# Patient Record
Sex: Male | Born: 1970 | Race: Black or African American | Hispanic: No | Smoking: Current every day smoker
Health system: Southern US, Community
[De-identification: ages and names within clinical notes are randomized; demographics above are authoritative.]

## PROBLEM LIST (undated history)

## (undated) DIAGNOSIS — I10 Essential (primary) hypertension: Secondary | ICD-10-CM

## (undated) DIAGNOSIS — G4730 Sleep apnea, unspecified: Secondary | ICD-10-CM

## (undated) HISTORY — DX: Sleep apnea, unspecified: G47.30

## (undated) HISTORY — DX: Essential (primary) hypertension: I10

## (undated) HISTORY — PX: HERNIA REPAIR: SHX51

---

## 1996-06-11 HISTORY — PX: HERNIA REPAIR: SHX51

## 2008-02-05 ENCOUNTER — Emergency Department: Payer: Self-pay

## 2009-02-03 ENCOUNTER — Emergency Department: Payer: Self-pay | Admitting: Emergency Medicine

## 2009-04-03 ENCOUNTER — Emergency Department: Payer: Self-pay | Admitting: Emergency Medicine

## 2010-01-20 ENCOUNTER — Emergency Department: Payer: Self-pay | Admitting: Emergency Medicine

## 2010-08-21 IMAGING — CR DG KNEE COMPLETE 4+V*L*
1 series · 4 of 4 positions shown · non-contrast
Comparison: none

REASON FOR EXAM: pain
COMMENTS:

[Series 1: view not recorded · 0.17mm/px · 4 of 4 slices shown]
[im 1/4]
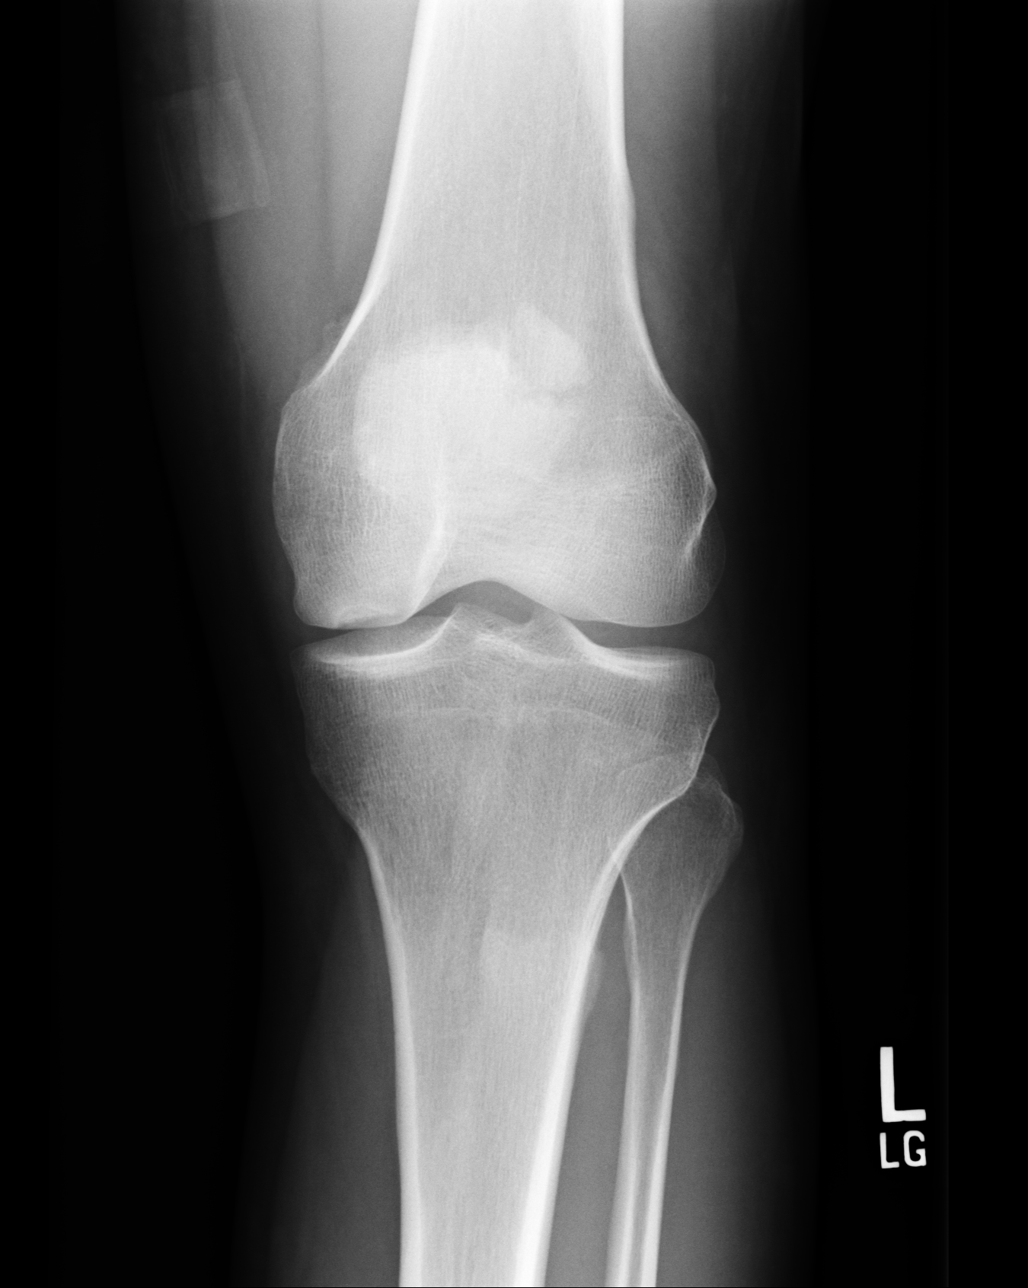
[im 2/4]
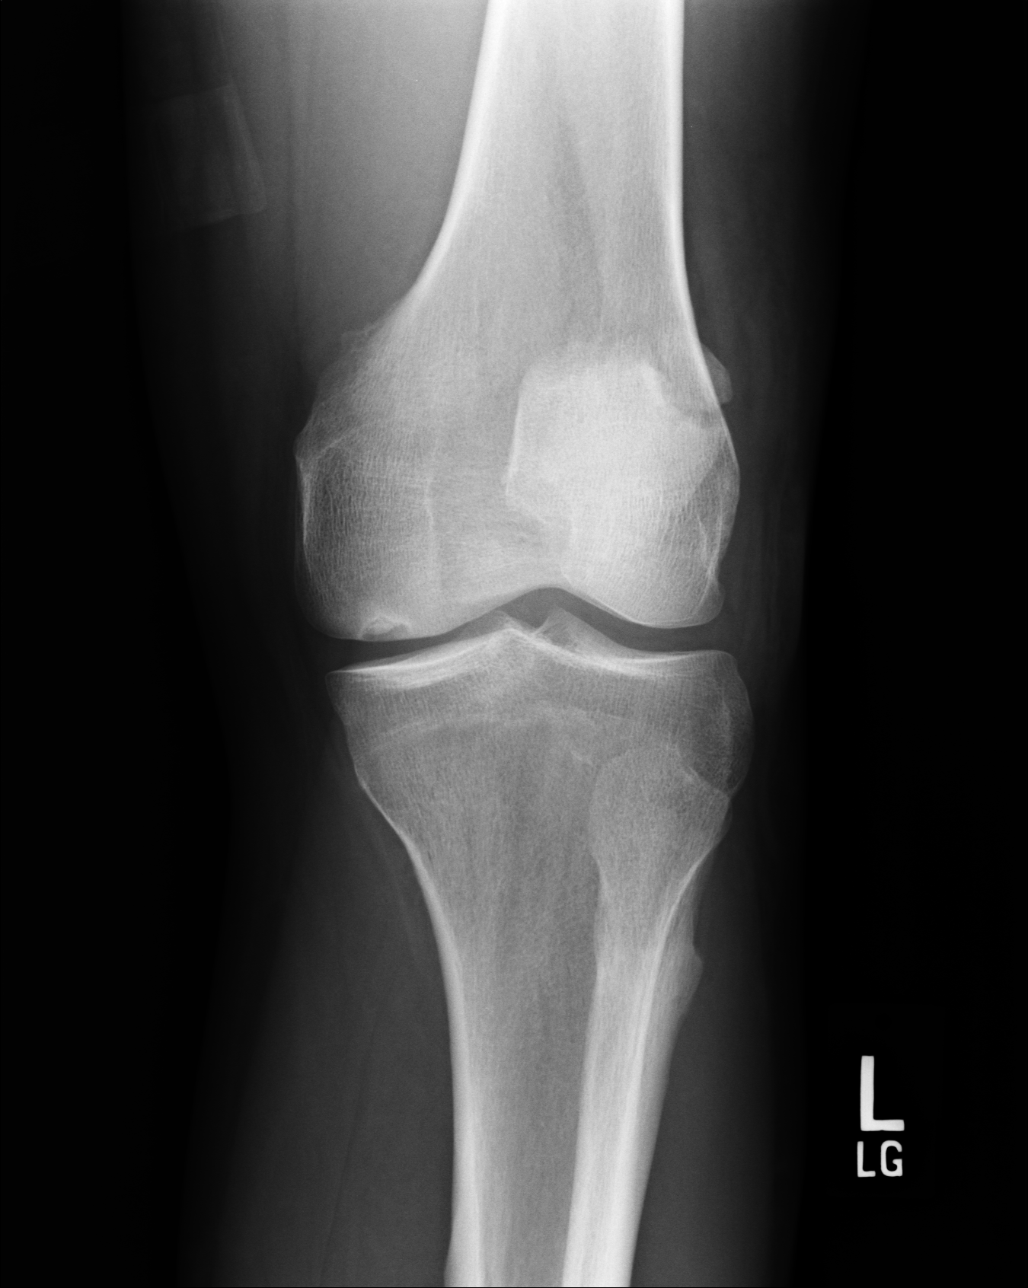
[im 3/4]
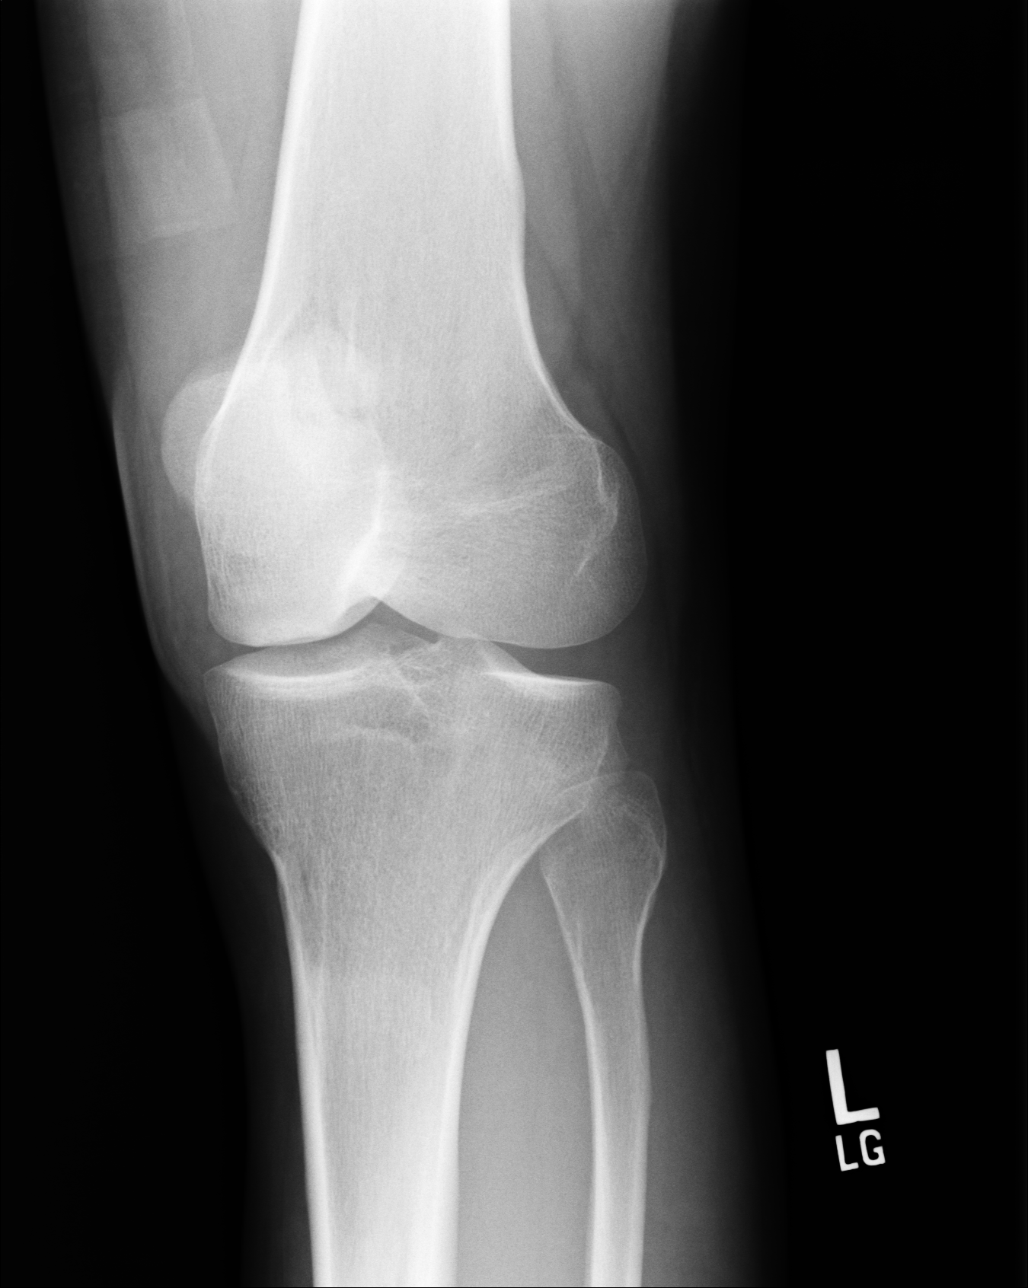
[im 4/4]
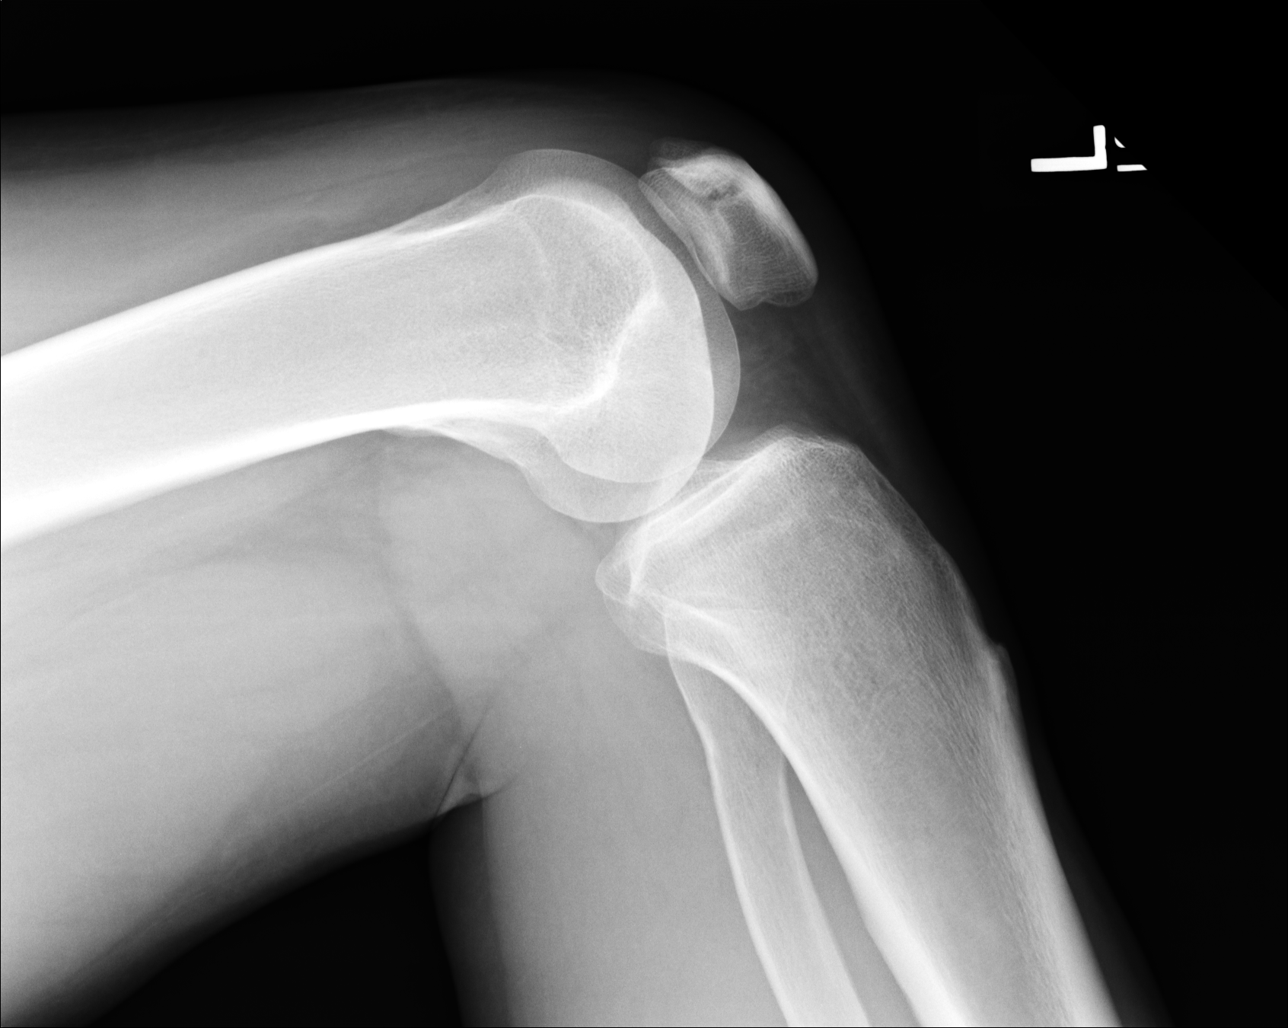

[4 of 4 positions shown; findings below may reference images not displayed]

PROCEDURE:     DXR - DXR KNEE LT COMP WITH OBLIQUES  - February 05, 2008  [DATE]

RESULT:     Four views of the knee were obtained. No fracture or dislocation
about the knee joint is seen. There is noted a radiolucent defect in the
medial femoral condyle consistent with osteochondritis dissecans. The knee
joint space is well maintained. There is a 2 cm detached bony density
associated with the superior outer margin of the patella. This likely is
secondary to bipartite patella. Fracture cannot be totally excluded from the
views available. Correlation with clinical findings is needed.
IMPRESSION: 1. There are observed changes consistent with osteochondritis dissecans of
the articular surface of the medial femoral condyle.
2. There is a lucency separating an osseous density from the body of the
patella along the superior outer margin of the patella. This could be
further evaluated by CT if clinically indicated.

## 2011-11-08 ENCOUNTER — Emergency Department: Payer: Self-pay | Admitting: Unknown Physician Specialty

## 2012-06-11 ENCOUNTER — Emergency Department: Payer: Self-pay | Admitting: Unknown Physician Specialty

## 2013-03-27 ENCOUNTER — Emergency Department: Payer: Self-pay | Admitting: Emergency Medicine

## 2013-03-27 LAB — CBC
HCT: 42.3 % (ref 40.0–52.0)
MCV: 84 fL (ref 80–100)
Platelet: 204 10*3/uL (ref 150–440)
WBC: 7 10*3/uL (ref 3.8–10.6)

## 2013-03-28 LAB — COMPREHENSIVE METABOLIC PANEL
Albumin: 3.4 g/dL (ref 3.4–5.0)
Alkaline Phosphatase: 117 U/L (ref 50–136)
Anion Gap: 5 — ABNORMAL LOW (ref 7–16)
Bilirubin,Total: 0.2 mg/dL (ref 0.2–1.0)
Calcium, Total: 8.5 mg/dL (ref 8.5–10.1)
Chloride: 106 mmol/L (ref 98–107)
Co2: 29 mmol/L (ref 21–32)
EGFR (Non-African Amer.): >60
Glucose: 108 mg/dL — ABNORMAL HIGH (ref 65–99)
Osmolality: 280 (ref 275–301)
Potassium: 3.7 mmol/L (ref 3.5–5.1)
SGOT(AST): 24 U/L (ref 15–37)
SGPT (ALT): 34 U/L (ref 12–78)
Sodium: 140 mmol/L (ref 136–145)
Total Protein: 8.1 g/dL (ref 6.4–8.2)

## 2013-03-28 LAB — URINALYSIS, COMPLETE
Bilirubin,UR: NEGATIVE
Glucose,UR: NEGATIVE mg/dL (ref 0–75)
Nitrite: NEGATIVE
Protein: NEGATIVE
RBC,UR: 1 /HPF (ref 0–5)
Specific Gravity: 1.032 (ref 1.003–1.030)
Squamous Epithelial: <1

## 2013-03-28 LAB — CK TOTAL AND CKMB (NOT AT ARMC): CK-MB: 0.5 ng/mL (ref 0.5–3.6)

## 2013-03-28 LAB — TROPONIN I: Troponin-I: <0.02 ng/mL

## 2015-10-26 ENCOUNTER — Emergency Department: Payer: Self-pay

## 2015-10-26 ENCOUNTER — Emergency Department
Admission: EM | Admit: 2015-10-26 | Discharge: 2015-10-26 | Discharge status: Against Medical Advice | Payer: Self-pay | Attending: Emergency Medicine | Admitting: Emergency Medicine

## 2015-10-26 ENCOUNTER — Encounter: Payer: Self-pay | Admitting: Emergency Medicine

## 2015-10-26 DIAGNOSIS — F17200 Nicotine dependence, unspecified, uncomplicated: Secondary | ICD-10-CM | POA: Insufficient documentation

## 2015-10-26 DIAGNOSIS — R221 Localized swelling, mass and lump, neck: Secondary | ICD-10-CM | POA: Insufficient documentation

## 2015-10-26 LAB — CBC WITH DIFFERENTIAL/PLATELET
BASOS PCT: 1 %
Basophils Absolute: 0.1 10*3/uL (ref 0–0.1)
EOS ABS: 0.2 10*3/uL (ref 0–0.7)
EOS PCT: 2 %
HCT: 42.2 % (ref 40.0–52.0)
Hemoglobin: 14.2 g/dL (ref 13.0–18.0)
LYMPHS ABS: 1.6 10*3/uL (ref 1.0–3.6)
Lymphocytes Relative: 21 %
MCH: 28.1 pg (ref 26.0–34.0)
MCHC: 33.6 g/dL (ref 32.0–36.0)
MCV: 83.5 fL (ref 80.0–100.0)
MONOS PCT: 11 %
Monocytes Absolute: 0.8 10*3/uL (ref 0.2–1.0)
Neutro Abs: 4.9 10*3/uL (ref 1.4–6.5)
Neutrophils Relative %: 65 %
PLATELETS: 182 10*3/uL (ref 150–440)
RBC: 5.05 MIL/uL (ref 4.40–5.90)
RDW: 14.1 % (ref 11.5–14.5)
WBC: 7.5 10*3/uL (ref 3.8–10.6)

## 2015-10-26 LAB — BASIC METABOLIC PANEL
Anion gap: 6 (ref 5–15)
BUN: 18 mg/dL (ref 6–20)
CALCIUM: 8.5 mg/dL — AB (ref 8.9–10.3)
CO2: 24 mmol/L (ref 22–32)
CREATININE: 1.01 mg/dL (ref 0.61–1.24)
Chloride: 108 mmol/L (ref 101–111)
Glucose, Bld: 89 mg/dL (ref 65–99)
Potassium: 3.6 mmol/L (ref 3.5–5.1)
SODIUM: 138 mmol/L (ref 135–145)

## 2015-10-26 NOTE — ED Notes (Signed)
Pt with right side jaw pain and swelling started yesterday afternoon. No injury. Denies any other symptoms at this time.

## 2015-10-26 NOTE — ED Notes (Signed)
Inform EDT that he wanted to leave  D/t wait times for test.  I called the u/s department to check on time . I explained that she was on her way to do test   But they left

## 2015-10-26 NOTE — ED Notes (Signed)
See triage note  Right side of jaw/facial swelling for couple of days  Unsure of fever   Afebrile on arrival..

## 2015-10-26 NOTE — ED Provider Notes (Signed)
[facility] Emergency Department Provider Note  ____________________________________________  Time seen: Approximately 1:53 PM  I have reviewed the triage vital signs and the nursing notes.   HISTORY  Chief Complaint Facial Pain    HPI Curtis Anderson is a 45 y.o. male , NAD, presents to the emergency department with one-day history of right jaw pain and swelling. States he noted some pain about his right jaw yesterday. Over the last 24 hours he was then the area has swollen significantly. Pain has been increasing since that time. Denies any fever, chills, body aches. Denies any dental pain, nasal congestion, runny nose, ear pain or sinus pressure. Denies any injuries or traumas to the face or neck. Has   History reviewed. No pertinent past medical history.  There are no active problems to display for this patient.   History reviewed. No pertinent past surgical history.  No current outpatient prescriptions on file.  Allergies Review of patient's allergies indicates no known allergies.  No family history on file.  Social History Social History  Substance Use Topics  . Smoking status: Current Every Day Smoker  . Smokeless tobacco: None  . Alcohol Use: Yes     Review of Systems  Constitutional: No fever/chills, fatigue Eyes: No visual changes. No discharge ENT: Swelling and pain about the right neck and jawline. No sore throat or nasal congestion, ear pain, runny nose, dental pain. Cardiovascular: No chest pain. Respiratory: No cough. No shortness of breath. No wheezing.  Gastrointestinal: No abdominal pain.  No nausea, vomiting. Musculoskeletal: Negative for back, neck pain.  Skin: Negative for rash, redness, skin sores, open wounds. Neurological: Negative for headaches, focal weakness or numbness. No tingling 10-point ROS otherwise negative.  ____________________________________________   PHYSICAL EXAM:  VITAL SIGNS: ED Triage Vitals   Enc Vitals Group     BP 10/26/15 1322 128/66 mmHg     Pulse Rate 10/26/15 1322 87     Resp 10/26/15 1322 18     Temp 10/26/15 1322 99.2 F (37.3 C)     Temp Source 10/26/15 1322 Oral     SpO2 10/26/15 1322 95 %     Weight 10/26/15 1322 210 lb (95.255 kg)     Height 10/26/15 1322 5\' 9"  (1.753 m)     Head Cir --      Peak Flow --      Pain Score 10/26/15 1324 5     Pain Loc --      Pain Edu? --      Excl. in GC? --      Constitutional: Alert and oriented. Well appearing and in no acute distress. Eyes: Conjunctivae are normal.  Head: Atraumatic. ENT:      Ears: Right TM visualized with mild bulging and a dusky appearance. No perforation or overt erythema is noted. Left TM visualized without bulging, effusion, erythema, perforation.      Nose: No congestion/rhinnorhea.      Mouth/Throat: Mucous membranes are moist. Poor dentition is noted but right upper and lower gumline with no erythema, swelling nor tenderness to palpation. Pharynx without erythema, swelling, exudates. Uvula is midline. Neck: Approximately 3 cm x 2 cm oblong swollen area along the right jaw line. Mild tenderness to palpation. Supple with full range of motion. Hematological/Lymphatic/Immunilogical: No cervical, preauricular, postauricular lymphadenopathy. Cardiovascular: Normal rate, regular rhythm. Normal S1 and S2.  Good peripheral circulation with 2+ pulses noted in the bilateral upper extremities. Respiratory: Normal respiratory effort without tachypnea or retractions. Lungs CTAB  breath sounds noted in all lung fields. Musculoskeletal: No TMJ tenderness. Neurologic:  Normal speech and language. No gross focal neurologic deficits are appreciated.  Skin:  Skin is warm, dry and intact. No rash, skin sores noted. Psychiatric: Mood and affect are normal. Speech and behavior are normal. Patient exhibits appropriate insight and judgement.   ____________________________________________   LABS (all labs ordered are  listed, but only abnormal results are displayed)  Labs Reviewed  BASIC METABOLIC PANEL - Abnormal; Notable for the following:    Calcium 8.5 (*)    All other components within normal limits  CBC WITH DIFFERENTIAL/PLATELET   ____________________________________________  EKG  None ____________________________________________  RADIOLOGY  I have personally viewed and evaluated these images (plain radiographs) as part of my medical decision making, as well as reviewing the written report by the radiologist.  No results found.   Patient left AMA as ultrasound tech was approaching to take him for his soft tissue ultrasound. ____________________________________________    PROCEDURES  Procedure(s) performed: None   Medications - No data to display   ____________________________________________   INITIAL IMPRESSION / ASSESSMENT AND PLAN / ED COURSE  Patient inquired in regards to the status of his ultrasound. I called ultrasound department and was told that the ultrasound tech was coming to get the patient for his imaging study. Patient stated that he wanted to leave and go to [facility] for further evaluation and treatment. Ultrasound tech entered the room and the patient continued to decline imaging at this time. Patient removed his IV forcibly and left the emergency department ambulating well without pain.     ____________________________________________  FINAL CLINICAL IMPRESSION(S) / ED DIAGNOSES  Final diagnoses:  Localized swelling, mass and lump, neck      NEW MEDICATIONS STARTED DURING THIS VISIT:  There are no discharge medications for this patient.        Hope Pigeon, PA-C 10/26/15 1547  Myrna Blazer, MD 10/27/15 [phone]

## 2016-11-19 ENCOUNTER — Emergency Department
Admission: EM | Admit: 2016-11-19 | Discharge: 2016-11-19 | Disposition: A | Discharge status: Home / Self Care | Payer: Self-pay | Attending: Emergency Medicine | Admitting: Emergency Medicine

## 2016-11-19 ENCOUNTER — Emergency Department: Payer: Self-pay

## 2016-11-19 ENCOUNTER — Encounter: Payer: Self-pay | Admitting: Emergency Medicine

## 2016-11-19 DIAGNOSIS — F17200 Nicotine dependence, unspecified, uncomplicated: Secondary | ICD-10-CM | POA: Insufficient documentation

## 2016-11-19 DIAGNOSIS — M940 Chondrocostal junction syndrome [Tietze]: Secondary | ICD-10-CM | POA: Insufficient documentation

## 2016-11-19 MED ORDER — KETOROLAC TROMETHAMINE 60 MG/2ML IM SOLN
60.0000 mg | Freq: Once | INTRAMUSCULAR | Status: AC
  Administered 2016-11-19: 60 mg via INTRAMUSCULAR
  Filled 2016-11-19: qty 2

## 2016-11-19 MED ORDER — CYCLOBENZAPRINE HCL 10 MG PO TABS: 10.0000 mg | ORAL_TABLET | Freq: Three times a day (TID) | ORAL | 0 refills | Status: DC | PRN

## 2016-11-19 MED ORDER — TRAMADOL HCL 50 MG PO TABS: 50.0000 mg | ORAL_TABLET | Freq: Four times a day (QID) | ORAL | 0 refills | Status: DC | PRN

## 2016-11-19 MED ORDER — IBUPROFEN 600 MG PO TABS: 600.0000 mg | ORAL_TABLET | Freq: Three times a day (TID) | ORAL | 0 refills | Status: DC | PRN

## 2016-11-19 MED ORDER — HYDROMORPHONE HCL 1 MG/ML IJ SOLN
1.0000 mg | Freq: Once | INTRAMUSCULAR | Status: AC
  Administered 2016-11-19: 1 mg via INTRAMUSCULAR
  Filled 2016-11-19: qty 1

## 2016-11-19 NOTE — ED Provider Notes (Signed)
[facility] Emergency Department Provider Note   ____________________________________________   First MD Initiated Contact with Patient 11/19/16 1443     (approximate)  I have reviewed the triage vital signs and the nursing notes.   HISTORY  Chief Complaint Rib pain    HPI Curtis Anderson is a 46 y.o. male  Of left anterior chest wall pain secondary to a forceful coughing episode. Patient stated pain causing a fall to his knees. Patient is now splinting with deep inspirations.Patient rates his pain as a 10 over 10. Patient is gravida pain as "sharp".   History reviewed. No pertinent past medical history.  There are no active problems to display for this patient.   History reviewed. No pertinent surgical history.  Prior to Admission medications   Medication Sig Start Date End Date Taking? Authorizing Provider  cyclobenzaprine (FLEXERIL) 10 MG tablet Take 1 tablet (10 mg total) by mouth 3 (three) times daily as needed. 11/19/16   Joni Reining, PA-C  ibuprofen (ADVIL,MOTRIN) 600 MG tablet Take 1 tablet (600 mg total) by mouth every 8 (eight) hours as needed. 11/19/16   Joni Reining, PA-C  traMADol (ULTRAM) 50 MG tablet Take 1 tablet (50 mg total) by mouth every 6 (six) hours as needed for moderate pain. 11/19/16   Joni Reining, PA-C    Allergies Patient has no known allergies.  No family history on file.  Social History Social History  Substance Use Topics  . Smoking status: Current Every Day Smoker  . Smokeless tobacco: Never Used  . Alcohol use Yes    Review of Systems  Constitutional: No fever/chills Eyes: No visual changes. ENT: No sore throat. Cardiovascular: Chest wall pain Respiratory: Denies shortness of breath. Gastrointestinal: No abdominal pain.  No nausea, no vomiting.  No diarrhea.  No constipation. Genitourinary: Negative for dysuria. Musculoskeletal: Negative for back pain. Skin: Negative for rash. Neurological:  Negative for headaches, focal weakness or numbness.   ____________________________________________   PHYSICAL EXAM:  VITAL SIGNS: ED Triage Vitals [11/19/16 1341]  Enc Vitals Group     BP 131/76     Pulse Rate 63     Resp 16     Temp 98 F (36.7 C)     Temp Source Oral     SpO2 95 %     Weight 215 lb (97.5 kg)     Height 5\' 9"  (1.753 m)     Head Circumference      Peak Flow      Pain Score 10     Pain Loc      Pain Edu?      Excl. in GC?     Constitutional: Alert and oriented. Well appearing and in no acute distress. Cardiovascular: Normal rate, regular rhythm. Grossly normal heart sounds.  Good peripheral circulation. Respiratory: Normal respiratory effort.  No retractions. Lungs CTAB. Musculoskeletal: with obvious chest wall deformity. Patient splinting with deep respirations. Lungs clear to auscultation. Neurologic:  Normal speech and language. No gross focal neurologic deficits are appreciated. No gait instability. Skin:  Skin is warm, dry and intact. No rash noted. Psychiatric: Mood and affect are normal. Speech and behavior are normal.  ____________________________________________   LABS (all labs ordered are listed, but only abnormal results are displayed)  Labs Reviewed - No data to display ____________________________________________  EKG   ____________________________________________  RADIOLOGY  Dg Chest 2 View  Result Date: 11/19/2016 CLINICAL DATA:  Left-sided chest pain and cough since this morning.  Left-sided chest pain began after coughing. EXAM: CHEST  2 VIEW COMPARISON:  03/27/2013 FINDINGS: Lungs are adequately inflated without effusion or pneumothorax. Subtle density over the mid aspect of the lung bases on the lateral film only likely atelectasis. Cardiomediastinal silhouette and remainder of the exam is unchanged. IMPRESSION: Subtle basilar density likely atelectasis. Recommend follow-up chest radiograph 4-6 weeks. Electronically Signed   By:  Elberta Fortisaniel  Boyle M.D.   On: 11/19/2016 15:10     No acute findings on x-ray of the chest.  ____________________________________________   PROCEDURES  Procedure(s) performed: None  Procedures  Critical Care performed: No  ____________________________________________   INITIAL IMPRESSION / ASSESSMENT AND PLAN / ED COURSE  Pertinent labs & imaging results that were available during my care of the patient were reviewed by me and considered in my medical decision making (see chart for details).   Costochondritis. Patient given discharge care instructions. Patient [facility].      ____________________________________________   FINAL CLINICAL IMPRESSION(S) / ED DIAGNOSES  Final diagnoses:  Costochondritis, acute      NEW MEDICATIONS STARTED DURING THIS VISIT:  New Prescriptions   CYCLOBENZAPRINE (FLEXERIL) 10 MG TABLET    Take 1 tablet (10 mg total) by mouth 3 (three) times daily as needed.   IBUPROFEN (ADVIL,MOTRIN) 600 MG TABLET    Take 1 tablet (600 mg total) by mouth every 8 (eight) hours as needed.   TRAMADOL (ULTRAM) 50 MG TABLET    Take 1 tablet (50 mg total) by mouth every 6 (six) hours as needed for moderate pain.     Note:  This document was prepared using Dragon voice recognition software and may include unintentional dictation errors.    Joni ReiningSmith, Ronald K, PA-C 11/19/16 1522    Merrily Brittleifenbark, Neil, MD 11/19/16 [phone]

## 2016-11-19 NOTE — ED Triage Notes (Addendum)
Pt reports coughing this morning and now has pain on left side. Pt denies illness. Pt states pain brought him to his knees. Pt denies hearing or feeling a pop. Pt reports side did not hurt until he coughed. Pt ambulatory to triage. Pt appears uncomfortable.

## 2016-11-19 NOTE — ED Notes (Signed)
See triage note  States he coughed while at work today  Felt a pop to left lateral chest/rib area  States pain took him to his knees

## 2018-09-03 ENCOUNTER — Other Ambulatory Visit: Payer: Self-pay

## 2018-09-03 ENCOUNTER — Emergency Department
Admission: EM | Admit: 2018-09-03 | Discharge: 2018-09-03 | Disposition: A | Discharge status: Home / Self Care | Payer: Self-pay | Attending: Emergency Medicine | Admitting: Emergency Medicine

## 2018-09-03 ENCOUNTER — Encounter: Payer: Self-pay | Admitting: Emergency Medicine

## 2018-09-03 DIAGNOSIS — Y929 Unspecified place or not applicable: Secondary | ICD-10-CM | POA: Insufficient documentation

## 2018-09-03 DIAGNOSIS — F17210 Nicotine dependence, cigarettes, uncomplicated: Secondary | ICD-10-CM | POA: Insufficient documentation

## 2018-09-03 DIAGNOSIS — Z79899 Other long term (current) drug therapy: Secondary | ICD-10-CM | POA: Insufficient documentation

## 2018-09-03 DIAGNOSIS — X58XXXA Exposure to other specified factors, initial encounter: Secondary | ICD-10-CM | POA: Insufficient documentation

## 2018-09-03 DIAGNOSIS — S39012A Strain of muscle, fascia and tendon of lower back, initial encounter: Secondary | ICD-10-CM | POA: Insufficient documentation

## 2018-09-03 DIAGNOSIS — Y999 Unspecified external cause status: Secondary | ICD-10-CM | POA: Insufficient documentation

## 2018-09-03 DIAGNOSIS — Y939 Activity, unspecified: Secondary | ICD-10-CM | POA: Insufficient documentation

## 2018-09-03 MED ORDER — MELOXICAM 15 MG PO TABS: 15.0000 mg | ORAL_TABLET | Freq: Every day | ORAL | 2 refills | Status: AC

## 2018-09-03 MED ORDER — CYCLOBENZAPRINE HCL 10 MG PO TABS: 10.0000 mg | ORAL_TABLET | Freq: Three times a day (TID) | ORAL | 0 refills | Status: DC | PRN

## 2018-09-03 NOTE — ED Provider Notes (Signed)
[facility] Emergency Department Provider Note ____________________________________________  Time seen: Approximately 3:44 PM  I have reviewed the triage vital signs and the nursing notes.   HISTORY  Chief Complaint Back Pain    HPI Curtis Anderson is a 48 y.o. male who presents to the emergency department for evaluation and treatment of right side low back pain without known injury. No relief with tylenol or advil. Symptoms started 3 days ago.   History reviewed. No pertinent past medical history.  There are no active problems to display for this patient.   History reviewed. No pertinent surgical history.  Prior to Admission medications   Medication Sig Start Date End Date Taking? Authorizing Provider  cyclobenzaprine (FLEXERIL) 10 MG tablet Take 1 tablet (10 mg total) by mouth 3 (three) times daily as needed. 09/03/18   Irl Bodie, Rulon Eisenmenger B, FNP  meloxicam (MOBIC) 15 MG tablet Take 1 tablet (15 mg total) by mouth daily. 09/03/18 09/03/19  Chinita Pester, FNP    Allergies Patient has no known allergies.  No family history on file.  Social History Social History   Tobacco Use  . Smoking status: Current Every Day Smoker    Packs/day: 0.50    Types: Cigarettes  . Smokeless tobacco: Never Used  Substance Use Topics  . Alcohol use: Yes  . Drug use: Yes    Types: Marijuana    Review of Systems Constitutional: Negative for fever. Cardiovascular: Negative for chest pain. Respiratory: Negative for shortness of breath. Musculoskeletal: Positive for right lower back pain Skin: Negative for open wound or lesion.  Neurological: Negative for decrease in sensation  ____________________________________________   PHYSICAL EXAM:  VITAL SIGNS: ED Triage Vitals [09/03/18 1405]  Enc Vitals Group     BP (!) 135/91     Pulse Rate 74     Resp 16     Temp (!) 97.4 F (36.3 C)     Temp Source Oral     SpO2 98 %     Weight 207 lb (93.9 kg)     Height  5\' 9"  (1.753 m)     Head Circumference      Peak Flow      Pain Score 8     Pain Loc      Pain Edu?      Excl. in GC?     Constitutional: Alert and oriented. Well appearing and in no acute distress. Eyes: Conjunctivae are clear without discharge or drainage Head: Atraumatic Neck: Supple Respiratory: No cough. Respirations are even and unlabored. Musculoskeletal: FROM of the back observed. Pain with raise from flexion. No focal midline tenderness of the spine. No CVA tenderness. Neurologic: Awake, alert, oriented. No radiculopathy.  Skin: No open wounds or lesions on the lower back.  Psychiatric: Affect and behavior are appropriate.  ____________________________________________   LABS (all labs ordered are listed, but only abnormal results are displayed)  Labs Reviewed - No data to display ____________________________________________  RADIOLOGY  Not indicated. ____________________________________________   PROCEDURES  Procedures  ____________________________________________   INITIAL IMPRESSION / ASSESSMENT AND PLAN / ED COURSE  Curtis Anderson is a 48 y.o. who presents to the emergency department for treatment of nontraumatic back pain. He works in [employer], but doesn't recall specific injury. He will be treated with flexeril and meloxicam. He is to follow up with primary care if not improving over the next few days.  Medications - No data to display  Pertinent labs & imaging results that were available  during my care of the patient were reviewed by me and considered in my medical decision making (see chart for details).  _________________________________________   FINAL CLINICAL IMPRESSION(S) / ED DIAGNOSES  Final diagnoses:  Strain of lumbar region, initial encounter    ED Discharge Orders         Ordered    cyclobenzaprine (FLEXERIL) 10 MG tablet  3 times daily PRN     09/03/18 1446    meloxicam (MOBIC) 15 MG tablet  Daily     09/03/18 1446            If controlled substance prescribed during this visit, 12 month history viewed on the [facility] prior to issuing an initial prescription for Schedule II or III opiod.   Chinita Pester, FNP 09/03/18 1549    Sharman Cheek, MD 09/12/18 [phone]

## 2018-09-03 NOTE — ED Triage Notes (Signed)
Pt in via POV, reports lower back pain x 3 days, denies any recent injury or preexisting back problems.  Ambulatory to triage, NAD noted at this time.

## 2018-09-03 NOTE — Discharge Instructions (Signed)
Follow up with primary care. °Return to the ER for symptoms that change or worsen if unable to schedule an appointment. °

## 2018-09-03 NOTE — ED Notes (Signed)
See triage note  Presents with lower back pain for about 3 days  States pain is moving into right leg  Ambulates well  Denies any injury

## 2020-07-20 ENCOUNTER — Emergency Department: Payer: Self-pay

## 2020-07-20 ENCOUNTER — Other Ambulatory Visit: Payer: Self-pay

## 2020-07-20 ENCOUNTER — Emergency Department
Admission: EM | Admit: 2020-07-20 | Discharge: 2020-07-20 | Disposition: A | Discharge status: Home / Self Care | Payer: Self-pay | Attending: Emergency Medicine | Admitting: Emergency Medicine

## 2020-07-20 DIAGNOSIS — F17210 Nicotine dependence, cigarettes, uncomplicated: Secondary | ICD-10-CM | POA: Insufficient documentation

## 2020-07-20 DIAGNOSIS — B9689 Other specified bacterial agents as the cause of diseases classified elsewhere: Secondary | ICD-10-CM

## 2020-07-20 DIAGNOSIS — A499 Bacterial infection, unspecified: Secondary | ICD-10-CM | POA: Insufficient documentation

## 2020-07-20 DIAGNOSIS — B353 Tinea pedis: Secondary | ICD-10-CM | POA: Insufficient documentation

## 2020-07-20 LAB — BASIC METABOLIC PANEL
Anion gap: 10 (ref 5–15)
BUN: 16 mg/dL (ref 6–20)
CO2: 24 mmol/L (ref 22–32)
Calcium: 8.6 mg/dL — ABNORMAL LOW (ref 8.9–10.3)
Chloride: 105 mmol/L (ref 98–111)
Creatinine, Ser: 0.84 mg/dL (ref 0.61–1.24)
GFR, Estimated: >60 mL/min (ref >60)
Glucose, Bld: 109 mg/dL — ABNORMAL HIGH (ref 70–99)
Potassium: 3.5 mmol/L (ref 3.5–5.1)
Sodium: 139 mmol/L (ref 135–145)

## 2020-07-20 LAB — CBC WITH DIFFERENTIAL/PLATELET
Abs Immature Granulocytes: 0.03 10*3/uL (ref 0.00–0.07)
Basophils Absolute: 0 10*3/uL (ref 0.0–0.1)
Basophils Relative: 0 %
Eosinophils Absolute: 0.1 10*3/uL (ref 0.0–0.5)
Eosinophils Relative: 2 %
HCT: 43.6 % (ref 39.0–52.0)
Hemoglobin: 14.8 g/dL (ref 13.0–17.0)
Immature Granulocytes: 0 %
Lymphocytes Relative: 30 %
Lymphs Abs: 2.5 10*3/uL (ref 0.7–4.0)
MCH: 28.5 pg (ref 26.0–34.0)
MCHC: 33.9 g/dL (ref 30.0–36.0)
MCV: 83.8 fL (ref 80.0–100.0)
Monocytes Absolute: 1.1 10*3/uL — ABNORMAL HIGH (ref 0.1–1.0)
Monocytes Relative: 13 %
Neutro Abs: 4.7 10*3/uL (ref 1.7–7.7)
Neutrophils Relative %: 55 %
Platelets: 192 10*3/uL (ref 150–400)
RBC: 5.2 MIL/uL (ref 4.22–5.81)
RDW: 14.2 % (ref 11.5–15.5)
WBC: 8.5 10*3/uL (ref 4.0–10.5)
nRBC: 0 % (ref 0.0–0.2)

## 2020-07-20 LAB — URIC ACID: Uric Acid, Serum: 6.6 mg/dL (ref 3.7–8.6)

## 2020-07-20 MED ORDER — SULFAMETHOXAZOLE-TRIMETHOPRIM 800-160 MG PO TABS
1.0000 | ORAL_TABLET | Freq: Once | ORAL | Status: AC
  Administered 2020-07-20: 1 via ORAL
  Filled 2020-07-20: qty 1

## 2020-07-20 MED ORDER — IBUPROFEN 600 MG PO TABS
600.0000 mg | ORAL_TABLET | Freq: Once | ORAL | Status: AC
  Administered 2020-07-20: 600 mg via ORAL
  Filled 2020-07-20: qty 1

## 2020-07-20 MED ORDER — OXYCODONE-ACETAMINOPHEN 7.5-325 MG PO TABS
1.0000 | ORAL_TABLET | Freq: Four times a day (QID) | ORAL | 0 refills | Status: DC | PRN
Start: 2020-07-20 — End: 2021-07-30

## 2020-07-20 MED ORDER — SULFAMETHOXAZOLE-TRIMETHOPRIM 800-160 MG PO TABS: 1.0000 | ORAL_TABLET | Freq: Two times a day (BID) | ORAL | 0 refills | Status: DC

## 2020-07-20 MED ORDER — OXYCODONE-ACETAMINOPHEN 5-325 MG PO TABS
1.0000 | ORAL_TABLET | Freq: Once | ORAL | Status: AC
  Administered 2020-07-20: 1 via ORAL
  Filled 2020-07-20: qty 1

## 2020-07-20 MED ORDER — NEOSPORIN PLUS PAIN RELIEF MS 3.5-10000-10 EX CREA: TOPICAL_CREAM | Freq: Two times a day (BID) | CUTANEOUS | 0 refills | Status: DC

## 2020-07-20 NOTE — ED Triage Notes (Signed)
Pt to ED POV for chief complaint of pinky toe pain in left foot that started yesterday. Denies injury

## 2020-07-20 NOTE — ED Notes (Signed)
Left foot placed in betadine soak per MD order. Pt tolerating well.

## 2020-07-20 NOTE — Discharge Instructions (Signed)
Follow discharge care instructions take medication as directed. °

## 2020-07-20 NOTE — ED Notes (Signed)
X-ray in with pt 

## 2020-07-20 NOTE — ED Provider Notes (Signed)
[facility] Emergency Department Provider Note   ____________________________________________   Event Date/Time   First MD Initiated Contact with Patient 07/20/20 [phone]     (approximate)  I have reviewed the triage vital signs and the nursing notes.   HISTORY  Chief Complaint Toe Pain    HPI Curtis Anderson is a 50 y.o. male patient complaint 3 days of left fifth toe pain.  Patient stated no provocative incident.  Patient notes increased redness and edema today.  Patient also notes drainage between the webspace of the fourth and fifth digit.  Rates pain as a 10/10.  Described pain as "sore".  No palliative measures for complaint.         History reviewed. No pertinent past medical history.  There are no problems to display for this patient.   History reviewed. No pertinent surgical history.  Prior to Admission medications   Medication Sig Start Date End Date Taking? Authorizing Provider  neomycin-polymyxin-pramoxine (NEOSPORIN PLUS) 1 % cream Apply topically 2 (two) times daily. 07/20/20  Yes Joni Reining, PA-C  oxyCODONE-acetaminophen (PERCOCET) 7.5-325 MG tablet Take 1 tablet by mouth every 6 (six) hours as needed for severe pain. 07/20/20  Yes Joni Reining, PA-C  sulfamethoxazole-trimethoprim (BACTRIM DS) 800-160 MG tablet Take 1 tablet by mouth 2 (two) times daily. 07/20/20  Yes Joni Reining, PA-C  cyclobenzaprine (FLEXERIL) 10 MG tablet Take 1 tablet (10 mg total) by mouth 3 (three) times daily as needed. 09/03/18   Chinita Pester, FNP    Allergies Patient has no known allergies.  No family history on file.  Social History Social History   Tobacco Use  . Smoking status: Current Every Day Smoker    Packs/day: 0.50    Types: Cigarettes  . Smokeless tobacco: Never Used  Vaping Use  . Vaping Use: Never used  Substance Use Topics  . Alcohol use: Yes  . Drug use: Yes    Types: Marijuana    Review of Systems Constitutional: No  fever/chills Eyes: No visual changes. ENT: No sore throat. Cardiovascular: Denies chest pain. Respiratory: Denies shortness of breath. Gastrointestinal: No abdominal pain.  No nausea, no vomiting.  No diarrhea.  No constipation. Genitourinary: Negative for dysuria. Musculoskeletal: Negative for back pain. Skin: Negative for rash.  Redness swelling left fifth toe. Neurological: Negative for headaches, focal weakness or numbness.   ____________________________________________   PHYSICAL EXAM:  VITAL SIGNS: ED Triage Vitals  Enc Vitals Group     BP 07/20/20 0733 130/89     Pulse Rate 07/20/20 0733 86     Resp 07/20/20 0733 18     Temp 07/20/20 0733 97.7 F (36.5 C)     Temp Source 07/20/20 0733 Oral     SpO2 07/20/20 0733 96 %     Weight 07/20/20 0732 216 lb (98 kg)     Height 07/20/20 0732 5\' 9"  (1.753 m)     Head Circumference --      Peak Flow --      Pain Score 07/20/20 0732 10     Pain Loc --      Pain Edu? --      Excl. in GC? --    Constitutional: Alert and oriented. Well appearing and in no acute distress. Cardiovascular: Normal rate, regular rhythm. Grossly normal heart sounds.  Good peripheral circulation. Respiratory: Normal respiratory effort.  No retractions. Lungs CTAB. Musculoskeletal: Edema and guarding with palpation dorsal aspect the left fifth toe.09/17/20 Neurologic:  Normal speech and language. No gross focal neurologic deficits are appreciated. No gait instability. Skin: Edema and erythema dorsal aspect of left fifth toe.  Purulent drainage webspace fourth and fifth digit left toe.   Psychiatric: Mood and affect are normal. Speech and behavior are normal.  ____________________________________________   LABS (all labs ordered are listed, but only abnormal results are displayed)  Labs Reviewed  BASIC METABOLIC PANEL - Abnormal; Notable for the following components:      Result Value   Glucose, Bld 109 (*)    Calcium 8.6 (*)    All other components  within normal limits  CBC WITH DIFFERENTIAL/PLATELET - Abnormal; Notable for the following components:   Monocytes Absolute 1.1 (*)    All other components within normal limits  URIC ACID   ____________________________________________  EKG   ____________________________________________  RADIOLOGY I, Joni Reining, personally viewed and evaluated these images (plain radiographs) as part of my medical decision making, as well as reviewing the written report by the radiologist.  ED MD interpretation: No acute findings x-ray of the left toe.  Official radiology report(s): DG Toe 5th Left  Result Date: 07/20/2020 CLINICAL DATA:  Left fifth toe pain for 3 days. EXAM: DG TOE 5TH LEFT COMPARISON:  None. FINDINGS: There is no evidence of fracture or dislocation. There is no evidence of arthropathy or other focal bone abnormality. Soft tissues are unremarkable. IMPRESSION: Negative. Electronically Signed   By: Lupita Raider M.D.   On: 07/20/2020 08:36    ____________________________________________   PROCEDURES  Procedure(s) performed (including Critical Care):  Procedures   ____________________________________________   INITIAL IMPRESSION / ASSESSMENT AND PLAN / ED COURSE  As part of my medical decision making, I reviewed the following data within the electronic MEDICAL RECORD NUMBER         Patient presents with pain, edema, and purulent drainage left fifth toe. Patient complaint physical exam consistent with bacterial skin infection secondary to a fungal infection. Patient placed in a Betadine foot soaks for 5 minutes. Area was cleaned and Neosporin applied. Patient placed in open shoe. Patient given discharge care instruction advised take medication as directed. Patient requested and was given consult to podiatry.      ____________________________________________   FINAL CLINICAL IMPRESSION(S) / ED DIAGNOSES  Final diagnoses:  Bacterial skin infection  Tinea pedis of  both feet     ED Discharge Orders         Ordered    sulfamethoxazole-trimethoprim (BACTRIM DS) 800-160 MG tablet  2 times daily        07/20/20 0850    neomycin-polymyxin-pramoxine (NEOSPORIN PLUS) 1 % cream  2 times daily        07/20/20 0850    oxyCODONE-acetaminophen (PERCOCET) 7.5-325 MG tablet  Every 6 hours PRN        07/20/20 0850          *Please note:  Curtis Anderson was evaluated in Emergency Department on 07/20/2020 for the symptoms described in the history of present illness. He was evaluated in the context of the global COVID-19 pandemic, which necessitated consideration that the patient might be at risk for infection with the SARS-CoV-2 virus that causes COVID-19. Institutional protocols and algorithms that pertain to the evaluation of patients at risk for COVID-19 are in a state of rapid change based on information released by regulatory bodies including the CDC and federal and state organizations. These policies and algorithms were followed during the patient's care in the ED.  Some ED evaluations and interventions may be delayed as a result of limited staffing during and the pandemic.*   Note:  This document was prepared using Dragon voice recognition software and may include unintentional dictation errors.    Joni Reining, PA-C 07/21/20 4098    Dionne Bucy, MD 07/26/20 1356

## 2020-07-20 NOTE — ED Notes (Signed)
Ron, PA at bedside. Pt states pain 10/10, wife at bedside. NAD.

## 2020-09-24 ENCOUNTER — Emergency Department
Admission: EM | Admit: 2020-09-24 | Discharge: 2020-09-24 | Disposition: A | Discharge status: Home / Self Care | Payer: Self-pay | Attending: Student in an Organized Health Care Education/Training Program | Admitting: Student in an Organized Health Care Education/Training Program

## 2020-09-24 ENCOUNTER — Emergency Department: Payer: Self-pay

## 2020-09-24 DIAGNOSIS — S161XXA Strain of muscle, fascia and tendon at neck level, initial encounter: Secondary | ICD-10-CM | POA: Diagnosis not present

## 2020-09-24 DIAGNOSIS — F17210 Nicotine dependence, cigarettes, uncomplicated: Secondary | ICD-10-CM | POA: Diagnosis not present

## 2020-09-24 DIAGNOSIS — S199XXA Unspecified injury of neck, initial encounter: Secondary | ICD-10-CM | POA: Diagnosis present

## 2020-09-24 DIAGNOSIS — R0789 Other chest pain: Secondary | ICD-10-CM | POA: Insufficient documentation

## 2020-09-24 DIAGNOSIS — Y92410 Unspecified street and highway as the place of occurrence of the external cause: Secondary | ICD-10-CM | POA: Diagnosis not present

## 2020-09-24 LAB — TROPONIN I (HIGH SENSITIVITY): Troponin I (High Sensitivity): 5 ng/L (ref <18)

## 2020-09-24 LAB — COMPREHENSIVE METABOLIC PANEL
ALT: 32 U/L (ref 0–44)
AST: 34 U/L (ref 15–41)
Albumin: 3.7 g/dL (ref 3.5–5.0)
Alkaline Phosphatase: 95 U/L (ref 38–126)
Anion gap: 7 (ref 5–15)
BUN: 16 mg/dL (ref 6–20)
CO2: 27 mmol/L (ref 22–32)
Calcium: 8.6 mg/dL — ABNORMAL LOW (ref 8.9–10.3)
Chloride: 106 mmol/L (ref 98–111)
Creatinine, Ser: 1 mg/dL (ref 0.61–1.24)
GFR, Estimated: >60 mL/min (ref >60)
Glucose, Bld: 92 mg/dL (ref 70–99)
Potassium: 3.7 mmol/L (ref 3.5–5.1)
Sodium: 140 mmol/L (ref 135–145)
Total Bilirubin: 0.6 mg/dL (ref 0.3–1.2)
Total Protein: 7.8 g/dL (ref 6.5–8.1)

## 2020-09-24 LAB — CBC
HCT: 44.7 % (ref 39.0–52.0)
Hemoglobin: 14.8 g/dL (ref 13.0–17.0)
MCH: 28 pg (ref 26.0–34.0)
MCHC: 33.1 g/dL (ref 30.0–36.0)
MCV: 84.7 fL (ref 80.0–100.0)
Platelets: 219 10*3/uL (ref 150–400)
RBC: 5.28 MIL/uL (ref 4.22–5.81)
RDW: 14.4 % (ref 11.5–15.5)
WBC: 8.2 10*3/uL (ref 4.0–10.5)
nRBC: 0 % (ref 0.0–0.2)

## 2020-09-24 MED ORDER — HYDROCODONE-ACETAMINOPHEN 5-325 MG PO TABS: 1.0000 | ORAL_TABLET | Freq: Four times a day (QID) | ORAL | 0 refills | Status: DC | PRN

## 2020-09-24 MED ORDER — ONDANSETRON HCL 4 MG/2ML IJ SOLN
4.0000 mg | Freq: Once | INTRAMUSCULAR | Status: AC
  Administered 2020-09-24: 4 mg via INTRAVENOUS
  Filled 2020-09-24: qty 2

## 2020-09-24 MED ORDER — MORPHINE SULFATE (PF) 4 MG/ML IV SOLN
4.0000 mg | INTRAVENOUS | Status: DC | PRN
  Filled 2020-09-24: qty 1
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
## 2020-09-24 NOTE — ED Notes (Signed)
X-ray at bedside

## 2021-07-30 ENCOUNTER — Emergency Department
Admission: EM | Admit: 2021-07-30 | Discharge: 2021-07-30 | Disposition: A | Discharge status: Home / Self Care | Payer: Self-pay | Attending: Emergency Medicine | Admitting: Emergency Medicine

## 2021-07-30 ENCOUNTER — Encounter: Payer: Self-pay | Admitting: Emergency Medicine

## 2021-07-30 ENCOUNTER — Other Ambulatory Visit: Payer: Self-pay

## 2021-07-30 DIAGNOSIS — M5442 Lumbago with sciatica, left side: Secondary | ICD-10-CM | POA: Insufficient documentation

## 2021-07-30 DIAGNOSIS — M5432 Sciatica, left side: Secondary | ICD-10-CM

## 2021-07-30 MED ORDER — OXYCODONE HCL 5 MG PO TABS: 5.0000 mg | ORAL_TABLET | Freq: Three times a day (TID) | ORAL | 0 refills | Status: DC | PRN

## 2021-07-30 MED ORDER — PREDNISONE 10 MG (21) PO TBPK: ORAL_TABLET | ORAL | 0 refills | Status: DC

## 2021-07-30 MED ORDER — KETOROLAC TROMETHAMINE 60 MG/2ML IM SOLN
30.0000 mg | Freq: Once | INTRAMUSCULAR | Status: AC
  Administered 2021-07-30: 30 mg via INTRAMUSCULAR
  Filled 2021-07-30: qty 2

## 2021-07-30 MED ORDER — ORPHENADRINE CITRATE 30 MG/ML IJ SOLN
60.0000 mg | Freq: Once | INTRAMUSCULAR | Status: AC
  Administered 2021-07-30: 60 mg via INTRAMUSCULAR
  Filled 2021-07-30: qty 2

## 2021-07-30 MED ORDER — OXYCODONE HCL 5 MG PO TABS
5.0000 mg | ORAL_TABLET | Freq: Once | ORAL | Status: AC
  Administered 2021-07-30: 5 mg via ORAL
  Filled 2021-07-30: qty 1

## 2021-07-30 MED ORDER — ORPHENADRINE CITRATE ER 100 MG PO TB12: 100.0000 mg | ORAL_TABLET | Freq: Two times a day (BID) | ORAL | 0 refills | Status: AC | PRN

## 2021-07-30 NOTE — Discharge Instructions (Signed)
Rest, use ice off and on throughout the day.  Follow up with the orthopedic specialist if not improving over the week.  Return to the ER for symptoms that change or worsen if unable to schedule an appointment.

## 2021-07-30 NOTE — ED Notes (Signed)
See triage note  presents with lower back pain which is moving into left leg  states he noticed the pain about 1 week ago denies any known injury  but states he works [employer] with slight limp d/t pain

## 2021-07-30 NOTE — ED Triage Notes (Signed)
Pt reports lower back pain for the past week. Pt denies injuries and reports the pain radiates all the way down his left leg.

## 2021-07-30 NOTE — ED Provider Notes (Signed)
[facility] Provider Note    Event Date/Time   First MD Initiated Contact with Patient 07/30/21 [phone]     (approximate)   History   Back Pain   HPI  Curtis Anderson is a 51 y.o. male with history of no chronic medical problems and as listed in EMR presents to the emergency department for evaluation of left lower back pain that radiates into the left leg. Symptom have been present for the past week. No relief with OTC medication and Percocet. He has had similar pain in the past, but has never lasted this long. No specific injury, but he works in [employer].       Physical Exam   Triage Vital Signs: ED Triage Vitals  Enc Vitals Group     BP 07/30/21 0935 (!) 120/97     Pulse Rate 07/30/21 0935 75     Resp 07/30/21 0935 20     Temp 07/30/21 0935 98.5 F (36.9 C)     Temp Source 07/30/21 0935 Oral     SpO2 07/30/21 0935 96 %     Weight 07/30/21 0933 220 lb (99.8 kg)     Height 07/30/21 0933 5\' 9"  (1.753 m)     Head Circumference --      Peak Flow --      Pain Score 07/30/21 0933 9     Pain Loc --      Pain Edu? --      Excl. in GC? --     Most recent vital signs: Vitals:   07/30/21 0935  BP: (!) 120/97  Pulse: 75  Resp: 20  Temp: 98.5 F (36.9 C)  SpO2: 96%    General: Awake, no distress.  CV:  Good peripheral perfusion.  Resp:  Normal effort.  Abd:  No distention.  Other:  Left lower back pain with radiculopathy into the anterior thigh to knee. Antalgic gait.    ED Results / Procedures / Treatments   Labs (all labs ordered are listed, but only abnormal results are displayed) Labs Reviewed - No data to display   EKG     RADIOLOGY  Image and radiology report reviewed by me.  Not indicated  PROCEDURES:  Critical Care performed: No  Procedures   MEDICATIONS ORDERED IN ED: Medications  ketorolac (TORADOL) injection 30 mg (30 mg Intramuscular Given 07/30/21 1006)  orphenadrine (NORFLEX) injection 60 mg (60 mg  Intramuscular Given 07/30/21 1006)  oxyCODONE (Oxy IR/ROXICODONE) immediate release tablet 5 mg (5 mg Oral Given 07/30/21 1006)     IMPRESSION / MDM / ASSESSMENT AND PLAN / ED COURSE   I have reviewed the triage note.  Differential diagnosis includes, but is not limited to, sciatica, disc disease, lumbar strain.  51 year old male presenting to the ER for treatment of low back pain that radiates into his left thigh. See HPI.  On exam, no red flags identified.   Patient reports improvement after medications. Prescriptions as listed below submitted to patient's pharmacy. Home care discussed. He is to follow up with orthopedics if not improving over the week. He is to return to the ER for symptoms that change or worsen if unable to see primary care or the specialist.       FINAL CLINICAL IMPRESSION(S) / ED DIAGNOSES   Final diagnoses:  Sciatica of left side     Rx / DC Orders   ED Discharge Orders          Ordered  predniSONE (STERAPRED UNI-PAK 21 TAB) 10 MG (21) TBPK tablet        07/30/21 1105    orphenadrine (NORFLEX) 100 MG tablet  2 times daily PRN        07/30/21 1105    oxyCODONE (ROXICODONE) 5 MG immediate release tablet  Every 8 hours PRN        07/30/21 1105             Note:  This document was prepared using Dragon voice recognition software and may include unintentional dictation errors.   Chinita Pester, FNP 07/30/21 1128    Concha Se, MD 07/30/21 1236

## 2021-08-06 ENCOUNTER — Other Ambulatory Visit: Payer: Self-pay

## 2021-08-06 ENCOUNTER — Emergency Department: Payer: Self-pay

## 2021-08-06 ENCOUNTER — Encounter: Payer: Self-pay | Admitting: Emergency Medicine

## 2021-08-06 ENCOUNTER — Emergency Department
Admission: EM | Admit: 2021-08-06 | Discharge: 2021-08-06 | Disposition: A | Discharge status: Home / Self Care | Payer: Self-pay | Attending: Emergency Medicine | Admitting: Emergency Medicine

## 2021-08-06 DIAGNOSIS — M5432 Sciatica, left side: Secondary | ICD-10-CM

## 2021-08-06 DIAGNOSIS — M5124 Other intervertebral disc displacement, thoracic region: Secondary | ICD-10-CM | POA: Insufficient documentation

## 2021-08-06 DIAGNOSIS — N1330 Unspecified hydronephrosis: Secondary | ICD-10-CM | POA: Insufficient documentation

## 2021-08-06 DIAGNOSIS — M5442 Lumbago with sciatica, left side: Secondary | ICD-10-CM | POA: Insufficient documentation

## 2021-08-06 DIAGNOSIS — M48061 Spinal stenosis, lumbar region without neurogenic claudication: Secondary | ICD-10-CM | POA: Insufficient documentation

## 2021-08-06 LAB — CBC
HCT: 47.7 % (ref 39.0–52.0)
Hemoglobin: 15.4 g/dL (ref 13.0–17.0)
MCH: 27 pg (ref 26.0–34.0)
MCHC: 32.3 g/dL (ref 30.0–36.0)
MCV: 83.5 fL (ref 80.0–100.0)
Platelets: 281 10*3/uL (ref 150–400)
RBC: 5.71 MIL/uL (ref 4.22–5.81)
RDW: 13.6 % (ref 11.5–15.5)
WBC: 6.9 10*3/uL (ref 4.0–10.5)
nRBC: 0 % (ref 0.0–0.2)

## 2021-08-06 LAB — URINALYSIS, ROUTINE W REFLEX MICROSCOPIC
Bilirubin Urine: NEGATIVE
Glucose, UA: NEGATIVE mg/dL
Hgb urine dipstick: NEGATIVE
Ketones, ur: NEGATIVE mg/dL
Leukocytes,Ua: NEGATIVE
Nitrite: NEGATIVE
Protein, ur: NEGATIVE mg/dL
Specific Gravity, Urine: 1.016 (ref 1.005–1.030)
pH: 5 (ref 5.0–8.0)

## 2021-08-06 LAB — BASIC METABOLIC PANEL
Anion gap: 10 (ref 5–15)
BUN: 16 mg/dL (ref 6–20)
CO2: 24 mmol/L (ref 22–32)
Calcium: 8.7 mg/dL — ABNORMAL LOW (ref 8.9–10.3)
Chloride: 104 mmol/L (ref 98–111)
Creatinine, Ser: 0.95 mg/dL (ref 0.61–1.24)
GFR, Estimated: >60 mL/min (ref >60)
Glucose, Bld: 101 mg/dL — ABNORMAL HIGH (ref 70–99)
Potassium: 3.6 mmol/L (ref 3.5–5.1)
Sodium: 138 mmol/L (ref 135–145)

## 2021-08-06 MED ORDER — PREDNISONE 10 MG PO TABS: ORAL_TABLET | ORAL | 0 refills | Status: DC

## 2021-08-06 MED ORDER — CYCLOBENZAPRINE HCL 10 MG PO TABS: 10.0000 mg | ORAL_TABLET | Freq: Three times a day (TID) | ORAL | 0 refills | Status: DC | PRN

## 2021-08-06 MED ORDER — OXYCODONE HCL 10 MG PO TABS: 10.0000 mg | ORAL_TABLET | Freq: Three times a day (TID) | ORAL | 0 refills | Status: DC | PRN

## 2021-08-06 MED ORDER — CYCLOBENZAPRINE HCL 10 MG PO TABS
10.0000 mg | ORAL_TABLET | Freq: Once | ORAL | Status: AC
  Administered 2021-08-06: 10 mg via ORAL
  Filled 2021-08-06: qty 1

## 2021-08-06 MED ORDER — KETOROLAC TROMETHAMINE 30 MG/ML IJ SOLN
30.0000 mg | Freq: Once | INTRAMUSCULAR | Status: AC
  Administered 2021-08-06: 30 mg via INTRAMUSCULAR
  Filled 2021-08-06: qty 1

## 2021-08-06 NOTE — ED Notes (Signed)
E signature pad not working. Pt educated on discharge instructions and verbalized understanding.  

## 2021-08-06 NOTE — Discharge Instructions (Addendum)
You were seen today for left-sided low back pain.  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  I am putting you back on steroids and muscle relaxers.  I have increased her pain medicine to 10 mg daily.  Ice may be helpful for 10 minutes 3 times daily.  Avoid overuse of your back, bending, twisting or heavy lifting.  Please call the neurosurgeon for a follow-up appointment next week.

## 2021-08-06 NOTE — ED Triage Notes (Signed)
Pt via POV from home. Pt c/o lower back pain on the L side. Pt states now he is having frequent urination and it is painful to urinate. Denies any foul odor or discharge. States he was here for same last week but they did not check his kidneys states the issues with urinating has started last 2 days. Denies NVD. Pt is A&OX4 and NAD.

## 2021-08-06 NOTE — ED Provider Notes (Signed)
[facility] Provider Note    Event Date/Time   First MD Initiated Contact with Patient 08/06/21 1040     (approximate)   History   Flank Pain   HPI  Curtis Anderson is a 51 y.o. male presents to the ER today with complaint of left-sided low back pain.  He reports this started 3 weeks ago.  The pain radiates into his left upper leg.  He describes the pain as a tightening and burning sensation.  The pain is constant but does intensify with certain movements.  He denies numbness, tingling or weakness of the left lower extremity.  He reports 2 days ago he started with urinary frequency, urgency and dysuria.  He denies blood in his urine, penile discharge, penile lesion, testicular pain or swelling.  He denies fever, chills, nausea or vomiting.  He denies loss of bowel or bladder control.  He was seen in the [facility] ED 2/19 for the same.  He was given Toradol, Norflex and Oxycodone.  He was discharged with Prednisone, Oxycodone and Norflex.  He reported no improvement in symptoms so he presented to [facility] ED 2/22 for the same.  Lumbar x-ray at that time showed mild degenerative changes but no acute findings.  They did not give him any additional medications but advised him to take the medicines he had previously been prescribed.  They also advised him to take Tylenol and ibuprofen OTC.  He reports nothing is working.  He reports he was referred to PT but has not started this at this time.  He denies any specific injury to the area but reports he does work in [employer].      Physical Exam   Triage Vital Signs: ED Triage Vitals  Enc Vitals Group     BP 08/06/21 0943 (!) 117/96     Pulse Rate 08/06/21 0943 69     Resp 08/06/21 0943 18     Temp 08/06/21 0943 98 F (36.7 C)     Temp Source 08/06/21 0943 Oral     SpO2 08/06/21 0943 96 %     Weight 08/06/21 0941 222 lb (100.7 kg)     Height 08/06/21 0941 5' 9.5" (1.765 m)     Head Circumference --      Peak Flow --       Pain Score 08/06/21 0941 8     Pain Loc --      Pain Edu? --      Excl. in GC? --     Most recent vital signs: Vitals:   08/06/21 0943 08/06/21 1406  BP: (!) 117/96 135/90  Pulse: 69 65  Resp: 18 18  Temp: 98 F (36.7 C) 98 F (36.7 C)  SpO2: 96% 93%    General: Awake, appears uncomfortable but in no distress.  CV:  RRR, no murmur noted. Resp:  Normal effort.  CTA bilaterally. Abd:  Normal bowel sounds.  Soft and nontender.  No CVA tenderness. Skin:  No rash noted. MSK:  Normal flexion, extension, rotation and lateral bending of the lumbar spine.  No bony tenderness noted over the lumbar spine.  No pain with palpation of the left lumbar paraspinal muscles.  He does have slight weakness with resistance to left hip flexion but otherwise strength 5/5 BLE.  He is able to stand on his tiptoes and heels.  Limping gait noted.   ED Results / Procedures / Treatments   Labs (all labs ordered are listed, but only abnormal  results are displayed) Labs Reviewed  URINALYSIS, ROUTINE W REFLEX MICROSCOPIC - Abnormal; Notable for the following components:      Result Value   Color, Urine YELLOW (*)    APPearance CLEAR (*)    All other components within normal limits  BASIC METABOLIC PANEL - Abnormal; Notable for the following components:   Glucose, Bld 101 (*)    Calcium 8.7 (*)    All other components within normal limits  CBC       RADIOLOGY  Imaging Orders         CT Renal Stone Study         CT L-SPINE NO CHARGE         MR LUMBAR SPINE WO CONTRAST     IMPRESSION: 1. No acute finding.  No hydronephrosis or ureteral calculus. 2. Small right renal calculi. 3. Fatty left groin hernia   IMPRESSION: 1. No acute finding. 2. Lumbar spine degeneration with multilevel foraminal impingement as listed above. 3. 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   IMPRESSION: 1. Multilevel degenerative disc disease of the lumbar spine as described. 2. Severe bilateral foraminal stenosis  at L4-L5. 3. Severe right and mild-moderate left foraminal stenosis at L3-L4. 4. Moderate-severe left foraminal stenosis at L2-L3. 5. Right paracentral disc protrusion at T11-T12 resulting in mild canal stenosis.  MEDICATIONS ORDERED IN ED:  Meds ordered this encounter  Medications   ketorolac (TORADOL) 30 MG/ML injection 30 mg   cyclobenzaprine (FLEXERIL) tablet 10 mg   predniSONE (DELTASONE) 10 MG tablet    Sig: Take 3 tabs on days 1-3, 2 tabs on days 4-6, 1 tab on days 7-9    Dispense:  18 tablet    Refill:  0    Order Specific Question:   Supervising Provider    Answer:   Emogene [place]   cyclobenzaprine (FLEXERIL) 10 MG tablet    Sig: Take 1 tablet (10 mg total) by mouth 3 (three) times daily as needed for muscle spasms.    Dispense:  30 tablet    Refill:  0    Order Specific Question:   Supervising Provider    Answer:   Jene Every [[id number]]   Oxycodone HCl 10 MG TABS    Sig: Take 1 tablet (10 mg total) by mouth every 8 (eight) hours as needed.    Dispense:  15 tablet    Refill:  0    Order Specific Question:   Supervising Provider    Answer:   Jene Every [[id number]]       IMPRESSION / MDM / ASSESSMENT AND PLAN / ED COURSE  I reviewed the triage vital signs and the nursing notes.  Left-Sided Low Back Pain with Left-Sided Sciatica with Urinary Urgency, Frequency and Dysuria:  Differential diagnosis includes, but is not limited to left-sided sciatica, kidney stone, UTI, STI  I reviewed this patient's last 2 ER visits including notes, labs and imaging Urinalysis does not show any evidence of infection BMET reveals normal kidney function electrolytes CBC shows no evidence of infection or anemia CT renal stone study with CT lumbar spine no charge Toradol 30 mg IM x1 Flexeril 10 mg p.o. x1 CT renal stone study negative for kidney stone on the left side, hydronephrosis per my read, confirmed by radiology CT lumbar spine shows possible herniation and foraminal  impingement at T12-L1 and L2-L3 MRI lumbar spine bulging disc at T11-T12 with foraminal stenosis noted from L2-L5 Rx for Pred taper x9 days Rx for Flexeril 10  mg every 8 hours as needed-sedation caution given Rx for Oxycodone 10 mg every 8 hours as needed Encouraged ice for 10 minutes 3 times daily Avoid overuse, excessive bending, twisting or heavy lifting   FINAL CLINICAL IMPRESSION(S) / ED DIAGNOSES   Left Sided Sciatica   Rx / DC Orders   ED Discharge Orders          Ordered    predniSONE (DELTASONE) 10 MG tablet        08/06/21 1444    cyclobenzaprine (FLEXERIL) 10 MG tablet  3 times daily PRN        08/06/21 1444    Oxycodone HCl 10 MG TABS  Every 8 hours PRN        08/06/21 1444             Note:  This document was prepared using Dragon voice recognition software and may include unintentional dictation errors.    Lorre Munroe, NP 08/06/21 1446    Shaune Pollack, MD 08/07/21 1515

## 2021-08-06 NOTE — ED Notes (Signed)
50 yom c/o mid back pain that travels down his left thigh area. The pt was recently diagnosed with sciatica. The pt stated his back pain has continued and he now is having trouble urinating for the past two days.

## 2021-08-21 NOTE — Progress Notes (Incomplete)
? ?  08/21/21 ?1:19 PM  ? ?Curtis Anderson ?Jul 08, 1970 ?[id number] ? ?Referring provider:  ?No referring provider defined for this encounter. ?No chief complaint on file. ? ? ? ?HPI: ?Curtis Anderson is a 51 y.o.male who presents today for further evaluation of enlarged prostate.  ? ?He was previously in the ED on 08/06/2021 for left sided sciatica . He underwent CT abdomen and pelvic that visualized no hydronephrosis or ?ureteral stone. 2 small calculi at the lower pole right kidney.Unremarkable bladder. ? ? ? ? ? ? ?PMH: ?No past medical history on file. ? ?Surgical History: ?Past Surgical History:  ?Procedure Laterality Date  ? HERNIA REPAIR    ? ? ?Home Medications:  ?Allergies as of 08/23/2021   ?No Known Allergies ?  ? ?  ?Medication List  ?  ? ?  ? Accurate as of August 21, 2021  1:19 PM. If you have any questions, ask your nurse or doctor.  ?  ?  ? ?  ? ?cyclobenzaprine 10 MG tablet ?Commonly known as: FLEXERIL ?Take 1 tablet (10 mg total) by mouth 3 (three) times daily as needed for muscle spasms. ?  ?Oxycodone HCl 10 MG Tabs ?Take 1 tablet (10 mg total) by mouth every 8 (eight) hours as needed. ?  ?predniSONE 10 MG tablet ?Commonly known as: DELTASONE ?Take 3 tabs on days 1-3, 2 tabs on days 4-6, 1 tab on days 7-9 ?  ? ?  ? ? ?Allergies: No Known Allergies ? ?Family History: ?No family history on file. ? ?Social History:  reports that he has been smoking cigarettes. He has been smoking an average of .5 packs per day. He has never used smokeless tobacco. He reports current alcohol use. He reports current drug use. Drug: Marijuana. ? ? ?Physical Exam: ?There were no vitals taken for this visit.  ?Constitutional:  Alert and oriented, No acute distress. ?HEENT: [place] AT, moist mucus membranes.  Trachea midline, no masses. ?Cardiovascular: No clubbing, cyanosis, or edema. ?Respiratory: Normal respiratory effort, no increased work of breathing. ?Skin: No rashes, bruises or suspicious lesions. ?Neurologic: Grossly intact,  no focal deficits, moving all 4 extremities. ?Psychiatric: Normal mood and affect. ? ?Laboratory Data: ?Lab Results  ?Component Value Date  ? CREATININE 0.95 08/06/2021  ? ? ? ?Assessment & Plan:   ? ? ?No follow-ups on file. ? ?I,Kailey Littlejohn,acting as a scribe for Vanna Scotland, MD.,have documented all relevant documentation on the behalf of Vanna Scotland, MD,as directed by  Vanna Scotland, MD while in the presence of Vanna Scotland, MD. ? ? ? [facility] ?[address] ?[place], [place] [address] ?[phone] ? ?

## 2021-08-23 ENCOUNTER — Ambulatory Visit: Payer: Self-pay | Admitting: Urology

## 2022-02-11 ENCOUNTER — Encounter: Payer: Self-pay | Admitting: Emergency Medicine

## 2022-02-11 ENCOUNTER — Other Ambulatory Visit: Payer: Self-pay

## 2022-02-11 ENCOUNTER — Emergency Department
Admission: EM | Admit: 2022-02-11 | Discharge: 2022-02-11 | Disposition: A | Discharge status: Home / Self Care | Payer: Self-pay | Attending: Emergency Medicine | Admitting: Emergency Medicine

## 2022-02-11 DIAGNOSIS — L03116 Cellulitis of left lower limb: Secondary | ICD-10-CM | POA: Insufficient documentation

## 2022-02-11 DIAGNOSIS — M7989 Other specified soft tissue disorders: Secondary | ICD-10-CM

## 2022-02-11 DIAGNOSIS — R600 Localized edema: Secondary | ICD-10-CM | POA: Insufficient documentation

## 2022-02-11 LAB — CBG MONITORING, ED: Glucose-Capillary: 99 mg/dL (ref 70–99)

## 2022-02-11 MED ORDER — CEPHALEXIN 500 MG PO CAPS: 500.0000 mg | ORAL_CAPSULE | Freq: Four times a day (QID) | ORAL | 0 refills | Status: AC

## 2022-02-11 MED ORDER — CEPHALEXIN 500 MG PO CAPS
500.0000 mg | ORAL_CAPSULE | Freq: Once | ORAL | Status: AC
  Administered 2022-02-11: 500 mg via ORAL
  Filled 2022-02-11: qty 1

## 2022-02-11 NOTE — Discharge Instructions (Signed)
Use Antibiotics for the full course as prescribed.  Please keep your wound clean by washing at least daily with soap and water. If you see any signs of infection like spreading redness, pus coming from the wound, extreme pain, fevers, chills or any other worsening doctor right away or come back to the emergency department  Thank you for choosing [place] for your health care today!  Please see your primary doctor this week for a follow up appointment.   Sometimes, in the early stages of certain disease courses it is difficult to detect in the emergency department evaluation -- so, it is important that you continue to monitor your symptoms and call your doctor right away or return to the emergency department if you develop any new or worsening symptoms.  It was my pleasure to care for you today.   Daneil Dan Modesto Charon, MD

## 2022-02-11 NOTE — ED Provider Notes (Addendum)
[facility] Provider Note    Event Date/Time   First MD Initiated Contact with Patient 02/11/22 1129     (approximate)   History   BLE swelling   HPI  Curtis Anderson is a 51 y.o. male   A pleasant 51 year old male with no known past medical history though he does not see a primary doctor who presents with bilateral lower extremity swelling and a bug bite which he scratched which he is concerned that is infected.  Noticed increased swelling to his ankles bilaterally specially after he works.  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  Some discomfort when his ankle swell, no trauma, no fever or systemic symptoms.  He is able to get the swelling to reduce after he puts his feet up after work  Denies chest pain, shortness of breath.  He has some redness surrounding an excoriated area which was itchy but no longer itchy, but denies fevers or chills or significant pain to the area.  History was obtained via patient and review of external medical notes.      Physical Exam   Triage Vital Signs: ED Triage Vitals  Enc Vitals Group     BP 02/11/22 1114 127/83     Pulse Rate 02/11/22 1114 67     Resp 02/11/22 1114 16     Temp 02/11/22 1114 97.8 F (36.6 C)     Temp Source 02/11/22 1114 Oral     SpO2 02/11/22 1114 95 %     Weight 02/11/22 1116 240 lb (108.9 kg)     Height 02/11/22 1116 5\' 9"  (1.753 m)     Head Circumference --      Peak Flow --      Pain Score 02/11/22 1115 6     Pain Loc --      Pain Edu? --      Excl. in GC? --     Most recent vital signs: Vitals:   02/11/22 1114  BP: 127/83  Pulse: 67  Resp: 16  Temp: 97.8 F (36.6 C)  SpO2: 95%    General: Awake, no distress.  Nontoxic and comfortable CV:  Good peripheral perfusion.  Resp:  Normal effort.  No wheezing or focalities Abd:  No distention.  Other:  Bilateral mild pitting edema to the ankles only.  Full range of active motion.  Neurovascular intact.   Sensate.  Gait is normal.  There is an area of excoriation to the lower anterior shin on the left with some surrounding erythema.  No pus.  No crepitus.  No pain out of proportion.   ED Results / Procedures / Treatments   Labs (all labs ordered are listed, but only abnormal results are displayed) Labs Reviewed  CBG MONITORING, ED     I reviewed labs and they are notable for blood glucose 99. PROCEDURES:  Critical Care performed: No  Procedures   MEDICATIONS ORDERED IN ED: Medications  cephALEXin (KEFLEX) capsule 500 mg (has no administration in time range)     IMPRESSION / MDM / ASSESSMENT AND PLAN / ED COURSE  I reviewed the triage vital signs and the nursing notes.                              Differential diagnosis includes, but is not limited to, cellulitis, lymphedema, considered but less likely heart failure, DVT, emergent skin infection like necrotizing fasciitis,  abscess.    MDM: Overall well-appearing patient with leg swelling most consistent with lymphedema which is worse after a day of work and better with elevating the limbs.  No systemic symptoms, and only mild cellulitic changes surrounding excoriation.  We administered antibiotics in the emergency department and I wrote a prescription for antibiotics for concern of worsening infection without treatment.  Dispo: After careful consideration of this patient's presentation, medical and social risk factors, and evaluation in the emergency department I engaged in shared decision making with the patient and/or their representative to consider admission or observation and this patient was ultimately discharged because his symptoms are mild and we have a plan for outpatient follow-up referral [facility], low concern for emergent causes requiring hospitalization at this time including systemic infection, severe heart failure exacerbation, DVT, compartment syndrome-though he will return with any worsening..    Patient's presentation is most consistent with acute, uncomplicated illness.       FINAL CLINICAL IMPRESSION(S) / ED DIAGNOSES   Final diagnoses:  Leg swelling  Cellulitis of left lower extremity     Rx / DC Orders   ED Discharge Orders          Ordered    cephALEXin (KEFLEX) 500 MG capsule  4 times daily        02/11/22 1142             Note:  This document was prepared using Dragon voice recognition software and may include unintentional dictation errors.    Pilar Jarvis, MD 02/11/22 1149    Pilar Jarvis, MD 02/11/22 1150

## 2022-02-11 NOTE — ED Notes (Signed)
Patient declined discharge vital signs. 

## 2022-02-11 NOTE — ED Triage Notes (Signed)
Pt here for BLE swelling. Swelling in lower leg, ankle, and feet.  Pt reports had itching to legs and when scratching legs opened a small area on both.  Generalized aching to lower legs.  Swelling started after he scratched legs open.  Denies SHOB, CP or orthopnea.  No fevers or recent travel. Pt denies smoking.  VSS. Ambulatory without difficulty.

## 2022-02-21 ENCOUNTER — Telehealth: Payer: Self-pay

## 2022-02-21 NOTE — Telephone Encounter (Signed)
ED referral received. Called pt to give info on clinic. No answer.

## 2023-07-25 ENCOUNTER — Ambulatory Visit: Payer: Medicaid Other | Admitting: Family Medicine

## 2023-07-25 ENCOUNTER — Encounter: Payer: Self-pay | Admitting: Family Medicine

## 2023-07-25 VITALS — BP 122/85 | HR 75 | Ht 69.0 in | Wt 222.6 lb

## 2023-07-25 DIAGNOSIS — G4713 Recurrent hypersomnia: Secondary | ICD-10-CM | POA: Insufficient documentation

## 2023-07-25 DIAGNOSIS — M545 Low back pain, unspecified: Secondary | ICD-10-CM | POA: Diagnosis not present

## 2023-07-25 DIAGNOSIS — E66811 Obesity, class 1: Secondary | ICD-10-CM | POA: Diagnosis not present

## 2023-07-25 DIAGNOSIS — R030 Elevated blood-pressure reading, without diagnosis of hypertension: Secondary | ICD-10-CM

## 2023-07-25 DIAGNOSIS — R5383 Other fatigue: Secondary | ICD-10-CM | POA: Insufficient documentation

## 2023-07-25 DIAGNOSIS — R0683 Snoring: Secondary | ICD-10-CM | POA: Insufficient documentation

## 2023-07-25 DIAGNOSIS — G4719 Other hypersomnia: Secondary | ICD-10-CM

## 2023-07-25 DIAGNOSIS — F17210 Nicotine dependence, cigarettes, uncomplicated: Secondary | ICD-10-CM

## 2023-07-25 DIAGNOSIS — Z114 Encounter for screening for human immunodeficiency virus [HIV]: Secondary | ICD-10-CM

## 2023-07-25 DIAGNOSIS — Z833 Family history of diabetes mellitus: Secondary | ICD-10-CM

## 2023-07-25 DIAGNOSIS — G8929 Other chronic pain: Secondary | ICD-10-CM

## 2023-07-25 DIAGNOSIS — Z1159 Encounter for screening for other viral diseases: Secondary | ICD-10-CM

## 2023-07-25 DIAGNOSIS — Z6832 Body mass index (BMI) 32.0-32.9, adult: Secondary | ICD-10-CM

## 2023-07-25 DIAGNOSIS — B353 Tinea pedis: Secondary | ICD-10-CM | POA: Diagnosis not present

## 2023-07-25 DIAGNOSIS — Z8042 Family history of malignant neoplasm of prostate: Secondary | ICD-10-CM | POA: Insufficient documentation

## 2023-07-25 NOTE — Assessment & Plan Note (Signed)
Epworth=16, extreme sleepiness Concerns for undiagnosed OSA Referral to pulm to Dr. Betti Cruz for formal eval and mgmt

## 2023-07-25 NOTE — Assessment & Plan Note (Signed)
Chronic,  2 packs per week Willing to cut down completely stop - motivated Discussed long terms risks- respiratory, cardiac, brain, and vascular issues I

## 2023-07-25 NOTE — Assessment & Plan Note (Signed)
Chronic, longstanding athlete's foot resistant to over-the-counter treatments. Appears severe in physical examination -Refer to Podiatry for evaluation and treatment. May need referral to Derm based on podiatry recommendations

## 2023-07-25 NOTE — Assessment & Plan Note (Signed)
States wakes up from snoring Friends and family have concerns about snoring Combined with hypersomnia - referral to pulm for sleep study work up for OSA
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
## 2023-07-25 NOTE — Assessment & Plan Note (Signed)
Recommend well balanced diet Weight loss Decrease processed foods, saturated fats, sugary drinks Will check A1C, lipid, and TSH

## 2023-07-25 NOTE — Assessment & Plan Note (Signed)
Father died of prostate CA Increase nocturia Will screen PSA

## 2023-07-25 NOTE — Assessment & Plan Note (Signed)
Will screen for diabetes A1C ordered

## 2023-07-25 NOTE — Assessment & Plan Note (Signed)
Chronic Pt believes related to sleep, most likely the case High risk for OSA Referral to pulm TSH and CBC checked to rule thyroid issues or anemia

## 2023-07-25 NOTE — Assessment & Plan Note (Signed)
Elevated DBP during visit GOAL<120/80 Decrease sodium intake Decrease processed foods Weight loss Increase water intake and exercise as tolerated CMP checked today

## 2023-07-25 NOTE — Assessment & Plan Note (Signed)
Epworth=16 Multiple naps throughout day Lack of good sleep at night Concerns for OSA Referral to Pulm, Dr. Betti Cruz for sleep study work up

## 2023-07-25 NOTE — Assessment & Plan Note (Addendum)
Longstanding back pain with history of spinal injections.  Patient has been advised to consider surgery or continued injections in the past. Has not seen ortho in over a year - requesting referral for further eval, imaging, and mgmt -Refer to Orthopedics for reevaluation and management plan. [facility]

## 2023-07-26 LAB — COMPREHENSIVE METABOLIC PANEL
ALT: 23 [IU]/L (ref 0–44)
AST: 16 [IU]/L (ref 0–40)
Albumin: 4.1 g/dL (ref 4.1–5.1)
Alkaline Phosphatase: 125 [IU]/L — ABNORMAL HIGH (ref 44–121)
BUN/Creatinine Ratio: 14 (ref 9–20)
BUN: 13 mg/dL (ref 6–24)
Bilirubin Total: 0.3 mg/dL (ref 0.0–1.2)
CO2: 25 mmol/L (ref 20–29)
Calcium: 9.2 mg/dL (ref 8.7–10.2)
Chloride: 106 mmol/L (ref 96–106)
Creatinine, Ser: 0.95 mg/dL (ref 0.76–1.27)
Globulin, Total: 3.6 g/dL (ref 1.5–4.5)
Glucose: 84 mg/dL (ref 70–99)
Potassium: 4.4 mmol/L (ref 3.5–5.2)
Sodium: 141 mmol/L (ref 134–144)
Total Protein: 7.7 g/dL (ref 6.0–8.5)
eGFR: 98 mL/min/{1.73_m2} (ref >59)

## 2023-07-26 LAB — HIV ANTIBODY (ROUTINE TESTING W REFLEX): HIV Screen 4th Generation wRfx: NONREACTIVE

## 2023-07-26 LAB — CBC
Hematocrit: 47 % (ref 37.5–51.0)
Hemoglobin: 15.5 g/dL (ref 13.0–17.7)
MCH: 27.8 pg (ref 26.6–33.0)
MCHC: 33 g/dL (ref 31.5–35.7)
MCV: 84 fL (ref 79–97)
Platelets: 234 10*3/uL (ref 150–450)
RBC: 5.57 x10E6/uL (ref 4.14–5.80)
RDW: 13.1 % (ref 11.6–15.4)
WBC: 5.7 10*3/uL (ref 3.4–10.8)

## 2023-07-26 LAB — HEMOGLOBIN A1C
Est. average glucose Bld gHb Est-mCnc: 128 mg/dL
Hgb A1c MFr Bld: 6.1 % — ABNORMAL HIGH (ref 4.8–5.6)

## 2023-07-26 LAB — PSA: Prostate Specific Ag, Serum: 0.9 ng/mL (ref 0.0–4.0)

## 2023-07-26 LAB — TSH: TSH: 0.509 u[IU]/mL (ref 0.450–4.500)

## 2023-07-26 LAB — HEPATITIS C ANTIBODY: Hep C Virus Ab: NONREACTIVE

## 2023-07-26 LAB — LDL CHOLESTEROL, DIRECT: LDL Direct: 101 mg/dL — ABNORMAL HIGH (ref 0–99)

## 2023-07-29 ENCOUNTER — Encounter: Payer: Self-pay | Admitting: Family Medicine

## 2023-08-06 ENCOUNTER — Institutional Professional Consult (permissible substitution): Payer: Medicaid Other | Admitting: Sleep Medicine

## 2023-08-06 ENCOUNTER — Ambulatory Visit: Payer: Self-pay | Admitting: Podiatry

## 2023-08-12 ENCOUNTER — Encounter: Payer: Self-pay | Admitting: Sleep Medicine

## 2023-08-12 ENCOUNTER — Ambulatory Visit: Admitting: Sleep Medicine

## 2023-08-12 VITALS — BP 138/88 | HR 72 | Temp 97.6°F | Ht 69.0 in | Wt 222.8 lb

## 2023-08-12 DIAGNOSIS — R0681 Apnea, not elsewhere classified: Secondary | ICD-10-CM | POA: Diagnosis not present

## 2023-08-12 DIAGNOSIS — R0683 Snoring: Secondary | ICD-10-CM

## 2023-08-12 DIAGNOSIS — Z6832 Body mass index (BMI) 32.0-32.9, adult: Secondary | ICD-10-CM

## 2023-08-12 DIAGNOSIS — G4733 Obstructive sleep apnea (adult) (pediatric): Secondary | ICD-10-CM

## 2023-08-12 DIAGNOSIS — E66811 Obesity, class 1: Secondary | ICD-10-CM

## 2023-08-12 DIAGNOSIS — R400 Somnolence: Secondary | ICD-10-CM | POA: Diagnosis not present

## 2023-08-12 NOTE — Patient Instructions (Signed)
 Marland Kitchen

## 2023-08-12 NOTE — Progress Notes (Signed)
 Name:Curtis Anderson MRN: [id number] DOB: February 13, 1971   CHIEF COMPLAINT:  EXCESSIVE DAYTIME SLEEPINESS   HISTORY OF PRESENT ILLNESS:  Curtis Anderson is a 53 y.o. w/ a h/o obesity who present for c/o loud snoring, witnessed apnea and excessive daytime sleepiness which has been present for several years. Reports nocturnal awakenings due to nocturia, however does not have difficulty falling back to sleep. Reports a 20 lb weight gain over the last two years. Admits to dry mouth. Denies morning headaches, RLS symptoms, dream enactment, cataplexy, hypnagogic or hypnapompic hallucinations. Reports a family history of sleep apnea. Reports drowsy driving. Drinks several glasses of tea daily, denies alcohol use, smokes 6 cigarettes daily, smokes Marijuana occasionally.   Bedtime 11 pm- 1 am Sleep onset 30 mins Rise time 7:30 am   EPWORTH SLEEP SCORE 18    07/25/2023    2:00 PM  Results of the Epworth flowsheet  Sitting and reading 3  Watching TV 2  Sitting, inactive in a public place (e.g. a theatre or a meeting) 2  As a passenger in a car for an hour without a break 2  Lying down to rest in the afternoon when circumstances permit 3  Sitting and talking to someone 1  Sitting quietly after a lunch without alcohol 1  In a car, while stopped for a few minutes in traffic 2  Total score 16      PAST MEDICAL HISTORY :   has no past medical history on file.  has a past surgical history that includes Hernia repair (1998). Prior to Admission medications   Not on File   No Known Allergies  FAMILY HISTORY:  family history includes Hypertension in his mother; Prostate cancer in his father; Stroke in his mother. SOCIAL HISTORY:  reports that he has been smoking cigarettes. He has never used smokeless tobacco. He reports current alcohol use. He reports current drug use. Drug: Marijuana.   Review of Systems:  Gen:  Denies  fever, sweats, chills weight loss  HEENT: Denies blurred vision,  double vision, ear pain, eye pain, hearing loss, nose bleeds, sore throat Cardiac:  No dizziness, chest pain or heaviness, chest tightness,edema, No JVD Resp:   No cough, -sputum production, -shortness of breath,-wheezing, -hemoptysis,  Gi: Denies swallowing difficulty, stomach pain, nausea or vomiting, diarrhea, constipation, bowel incontinence Gu:  Denies bladder incontinence, burning urine Ext:   Denies Joint pain, stiffness or swelling Skin: Denies  skin rash, easy bruising or bleeding or hives Endoc:  Denies polyuria, polydipsia , polyphagia or weight change Psych:   Denies depression, insomnia or hallucinations  Other:  All other systems negative  VITAL SIGNS: BP 138/88 (BP Location: Left Arm, Patient Position: Sitting, Cuff Size: Normal)   Pulse 72   Temp 97.6 F (36.4 C) (Temporal)   Ht 5\' 9"  (1.753 m)   Wt 222 lb 12.8 oz (101.1 kg)   SpO2 99%   BMI 32.90 kg/m     Physical Examination:   General Appearance: No distress  EYES PERRLA, EOM intact.   NECK Supple, No JVD Mallampati IV Pulmonary: normal breath sounds, No wheezing.  CardiovascularNormal S1,S2.  No m/r/g.   Abdomen: Benign, Soft, non-tender. Skin:   warm, no rashes, no ecchymosis  Extremities: normal, no cyanosis, clubbing. Neuro:without focal findings,  speech normal  PSYCHIATRIC: Mood, affect within normal limits.   ASSESSMENT AND PLAN  OSA I suspect that OSA is likely present due to clinical presentation. Discussed the consequences of untreated  sleep apnea. Advised not to drive drowsy for safety of patient and others. Will complete further evaluation with a home sleep study and follow up to review results.    Obesity Counseled patient on diet and lifestyle modification.    MEDICATION ADJUSTMENTS/LABS AND TESTS ORDERED: Recommend Sleep Study   Patient  satisfied with Plan of action and management. All questions answered  Follow up to review HST results and treatment plan.   I spent a total of  34 minutes reviewing chart data, face-to-face evaluation with the patient, counseling and coordination of care as detailed above.    Tempie Hoist, M.D.  Sleep Medicine [facility]

## 2023-08-26 ENCOUNTER — Ambulatory Visit

## 2023-08-26 DIAGNOSIS — G4733 Obstructive sleep apnea (adult) (pediatric): Secondary | ICD-10-CM

## 2023-08-29 ENCOUNTER — Ambulatory Visit: Admitting: Podiatry

## 2023-09-02 DIAGNOSIS — G4733 Obstructive sleep apnea (adult) (pediatric): Secondary | ICD-10-CM | POA: Diagnosis not present

## 2023-09-09 ENCOUNTER — Encounter: Payer: Self-pay | Admitting: Sleep Medicine

## 2023-09-09 ENCOUNTER — Ambulatory Visit: Admitting: Sleep Medicine

## 2023-09-09 VITALS — BP 138/80 | HR 81 | Temp 96.9°F | Ht 69.0 in | Wt 219.2 lb

## 2023-09-09 DIAGNOSIS — G4733 Obstructive sleep apnea (adult) (pediatric): Secondary | ICD-10-CM

## 2023-09-09 DIAGNOSIS — E669 Obesity, unspecified: Secondary | ICD-10-CM | POA: Diagnosis not present

## 2023-09-09 DIAGNOSIS — Z6832 Body mass index (BMI) 32.0-32.9, adult: Secondary | ICD-10-CM | POA: Diagnosis not present

## 2023-09-09 NOTE — Progress Notes (Signed)
 Name:Curtis Anderson MRN: [id number] DOB: Mar 03, 1971   CHIEF COMPLAINT:  HST F/U   HISTORY OF PRESENT ILLNESS:  Curtis Anderson is a 53 y.o. w/ a h/o obesity who presents to follow up on HST results. The patient underwent HST which revealed moderate OSA (AHI 21, O2 nadir 81%).   EPWORTH SLEEP SCORE 18    08/12/2023    1:28 PM 07/25/2023    2:00 PM  Results of the Epworth flowsheet  Sitting and reading 3 3  Watching TV 3 2  Sitting, inactive in a public place (e.g. a theatre or a meeting) 3 2  As a passenger in a car for an hour without a break 3 2  Lying down to rest in the afternoon when circumstances permit 3 3  Sitting and talking to someone 3 1  Sitting quietly after a lunch without alcohol 0 1  In a car, while stopped for a few minutes in traffic 0 2  Total score 18 16     PAST MEDICAL HISTORY :   has no past medical history on file.  has a past surgical history that includes Hernia repair (1998). Prior to Admission medications   Not on File   No Known Allergies  FAMILY HISTORY:  family history includes Hypertension in his mother; Prostate cancer in his father; Stroke in his mother. SOCIAL HISTORY:  reports that he has been smoking cigarettes. He has never used smokeless tobacco. He reports current alcohol use. He reports current drug use. Drug: Marijuana.   Review of Systems:  Gen:  Denies  fever, sweats, chills weight loss  HEENT: Denies blurred vision, double vision, ear pain, eye pain, hearing loss, nose bleeds, sore throat Cardiac:  No dizziness, chest pain or heaviness, chest tightness,edema, No JVD Resp:   No cough, -sputum production, -shortness of breath,-wheezing, -hemoptysis,  Gi: Denies swallowing difficulty, stomach pain, nausea or vomiting, diarrhea, constipation, bowel incontinence Gu:  Denies bladder incontinence, burning urine Ext:   Denies Joint pain, stiffness or swelling Skin: Denies  skin rash, easy bruising or bleeding or hives Endoc:   Denies polyuria, polydipsia , polyphagia or weight change Psych:   Denies depression, insomnia or hallucinations  Other:  All other systems negative  VITAL SIGNS: BP 138/80 (BP Location: Right Arm, Cuff Size: Large)   Pulse 81   Temp (!) 96.9 F (36.1 C)   Ht 5\' 9"  (1.753 m)   Wt 219 lb 3.2 oz (99.4 kg)   SpO2 95%   BMI 32.37 kg/m    Physical Examination:   General Appearance: No distress  EYES PERRLA, EOM intact.   NECK Supple, No JVD Pulmonary: normal breath sounds, No wheezing.  CardiovascularNormal S1,S2.  No m/r/g.   Abdomen: Benign, Soft, non-tender. Skin:   warm, no rashes, no ecchymosis  Extremities: normal, no cyanosis, clubbing. Neuro:without focal findings,  speech normal  PSYCHIATRIC: Mood, affect within normal limits.   ASSESSMENT AND PLAN  OSA Reviewed HST results with patient. For moderate OSA, starting patient on APAP therapy set to 4-16 cm H2O. Discussed the consequences of untreated sleep apnea. Advised not to drive drowsy for safety of patient and others. Will follow up in 3 months to review CPAP efficacy and compliance data.     Obesity  Counseled patient on diet and lifestyle modification.   Patient  satisfied with Plan of action and management. All questions answered  I spent a total of 31 minutes reviewing chart data, face-to-face evaluation with  the patient, counseling and coordination of care as detailed above.    Tempie Hoist, M.D.  Sleep Medicine [facility]

## 2023-09-09 NOTE — Patient Instructions (Signed)

## 2023-09-10 ENCOUNTER — Ambulatory Visit: Admitting: Podiatry

## 2023-09-10 DIAGNOSIS — B351 Tinea unguium: Secondary | ICD-10-CM | POA: Diagnosis not present

## 2023-09-10 DIAGNOSIS — B999 Unspecified infectious disease: Secondary | ICD-10-CM

## 2023-09-10 MED ORDER — DOXYCYCLINE HYCLATE 100 MG PO TABS: 100.0000 mg | ORAL_TABLET | Freq: Two times a day (BID) | ORAL | 0 refills | Status: DC

## 2023-09-10 MED ORDER — TERBINAFINE HCL 250 MG PO TABS
250.0000 mg | ORAL_TABLET | Freq: Every day | ORAL | 0 refills | Status: DC
Start: 2023-09-10 — End: 2024-04-26

## 2023-09-10 NOTE — Progress Notes (Signed)
  Subjective:  Patient ID: Curtis Anderson, male    DOB: [birth year],  MRN: [id number]  Chief Complaint  Patient presents with   Tinea Pedis    53 y.o. male presents with the above complaint.  Patient presents with bilateral second third and fourth interdigital space maceration and athlete's foot he states has been present for quite some time is progressive gotten worse worse with ambulation worse with pressure he would like to discuss treatment options for this pain scale is 5 out of 10 dull aching nature he has had this for years is progressive gotten worse   Review of Systems: Negative except as noted in the HPI. Denies N/V/F/Ch.  No past medical history on file. No current outpatient medications on file.  Social History   Tobacco Use  Smoking Status Every Day   Current packs/day: 0.50   Types: Cigarettes  Smokeless Tobacco Never    No Known Allergies Objective:  There were no vitals filed for this visit. There is no height or weight on file to calculate BMI. Constitutional Well developed. Well nourished.  Vascular Dorsalis pedis pulses palpable bilaterally. Posterior tibial pulses palpable bilaterally. Capillary refill normal to all digits.  No cyanosis or clubbing noted. Pedal hair growth normal.  Neurologic Normal speech. Oriented to person, place, and time. Epicritic sensation to light touch grossly present bilaterally.  Dermatologic Bilateral second third fourth interdigital space maceration with superinfection.  No open wounds or lesion noted heavily macerated skin  Orthopedic: Normal joint ROM without pain or crepitus bilaterally. No visible deformities. No bony tenderness.   Radiographs: None Assessment:   1. Superinfection   2. Onychomycosis due to dermatophyte    Plan:  Patient was evaluated and treated and all questions answered.  Bilateral superinfection of second third fourth interdigital space -All questions and concerns were discussed with the  patient in extensive detail -I encouraged her him to do Betadine paint between the toes for next 6 weeks daily he states understand will do so immediately -Also discussed that he will benefit from antifungal as well as antibiotic both Lamisil and doxycycline was sent to the pharmacy -Discussed shoe gear modification and pulse ox  No follow-ups on file.

## 2023-10-22 ENCOUNTER — Ambulatory Visit: Admitting: Podiatry

## 2023-11-19 ENCOUNTER — Emergency Department
Admission: EM | Admit: 2023-11-19 | Discharge: 2023-11-19 | Disposition: A | Discharge status: Home / Self Care | Attending: Emergency Medicine | Admitting: Emergency Medicine

## 2023-11-19 ENCOUNTER — Emergency Department

## 2023-11-19 ENCOUNTER — Other Ambulatory Visit: Payer: Self-pay

## 2023-11-19 DIAGNOSIS — W19XXXA Unspecified fall, initial encounter: Secondary | ICD-10-CM

## 2023-11-19 DIAGNOSIS — S52124A Nondisplaced fracture of head of right radius, initial encounter for closed fracture: Secondary | ICD-10-CM | POA: Insufficient documentation

## 2023-11-19 DIAGNOSIS — R079 Chest pain, unspecified: Secondary | ICD-10-CM | POA: Insufficient documentation

## 2023-11-19 DIAGNOSIS — S82891A Other fracture of right lower leg, initial encounter for closed fracture: Secondary | ICD-10-CM | POA: Diagnosis not present

## 2023-11-19 DIAGNOSIS — W1789XA Other fall from one level to another, initial encounter: Secondary | ICD-10-CM | POA: Diagnosis not present

## 2023-11-19 DIAGNOSIS — R519 Headache, unspecified: Secondary | ICD-10-CM | POA: Diagnosis present

## 2023-11-19 DIAGNOSIS — Z23 Encounter for immunization: Secondary | ICD-10-CM | POA: Insufficient documentation

## 2023-11-19 LAB — CBC WITH DIFFERENTIAL/PLATELET
Abs Immature Granulocytes: 0.02 10*3/uL (ref 0.00–0.07)
Basophils Absolute: 0 10*3/uL (ref 0.0–0.1)
Basophils Relative: 1 %
Eosinophils Absolute: 0.1 10*3/uL (ref 0.0–0.5)
Eosinophils Relative: 2 %
HCT: 46 % (ref 39.0–52.0)
Hemoglobin: 15.3 g/dL (ref 13.0–17.0)
Immature Granulocytes: 0 %
Lymphocytes Relative: 29 %
Lymphs Abs: 1.8 10*3/uL (ref 0.7–4.0)
MCH: 28.1 pg (ref 26.0–34.0)
MCHC: 33.3 g/dL (ref 30.0–36.0)
MCV: 84.4 fL (ref 80.0–100.0)
Monocytes Absolute: 0.5 10*3/uL (ref 0.1–1.0)
Monocytes Relative: 8 %
Neutro Abs: 3.6 10*3/uL (ref 1.7–7.7)
Neutrophils Relative %: 60 %
Platelets: 229 10*3/uL (ref 150–400)
RBC: 5.45 MIL/uL (ref 4.22–5.81)
RDW: 14 % (ref 11.5–15.5)
WBC: 6 10*3/uL (ref 4.0–10.5)
nRBC: 0 % (ref 0.0–0.2)

## 2023-11-19 LAB — COMPREHENSIVE METABOLIC PANEL WITH GFR
ALT: 28 U/L (ref 0–44)
AST: 26 U/L (ref 15–41)
Albumin: 3.9 g/dL (ref 3.5–5.0)
Alkaline Phosphatase: 95 U/L (ref 38–126)
Anion gap: 10 (ref 5–15)
BUN: 14 mg/dL (ref 6–20)
CO2: 23 mmol/L (ref 22–32)
Calcium: 9 mg/dL (ref 8.9–10.3)
Chloride: 107 mmol/L (ref 98–111)
Creatinine, Ser: 1.25 mg/dL — ABNORMAL HIGH (ref 0.61–1.24)
GFR, Estimated: >60 mL/min (ref >60)
Glucose, Bld: 95 mg/dL (ref 70–99)
Potassium: 3.9 mmol/L (ref 3.5–5.1)
Sodium: 140 mmol/L (ref 135–145)
Total Bilirubin: 0.8 mg/dL (ref 0.0–1.2)
Total Protein: 8.5 g/dL — ABNORMAL HIGH (ref 6.5–8.1)

## 2023-11-19 MED ORDER — OXYCODONE-ACETAMINOPHEN 5-325 MG PO TABS
1.0000 | ORAL_TABLET | Freq: Once | ORAL | Status: AC
  Administered 2023-11-19: 1 via ORAL
  Filled 2023-11-19: qty 1

## 2023-11-19 MED ORDER — ONDANSETRON HCL 4 MG/2ML IJ SOLN
4.0000 mg | Freq: Once | INTRAMUSCULAR | Status: AC
  Administered 2023-11-19: 4 mg via INTRAVENOUS
  Filled 2023-11-19: qty 2

## 2023-11-19 MED ORDER — TETANUS-DIPHTH-ACELL PERTUSSIS 5-2.5-18.5 LF-MCG/0.5 IM SUSY
0.5000 mL | PREFILLED_SYRINGE | Freq: Once | INTRAMUSCULAR | Status: AC
  Administered 2023-11-19: 0.5 mL via INTRAMUSCULAR
  Filled 2023-11-19: qty 0.5

## 2023-11-19 MED ORDER — MORPHINE SULFATE (PF) 4 MG/ML IV SOLN
4.0000 mg | Freq: Once | INTRAVENOUS | Status: AC
  Administered 2023-11-19: 4 mg via INTRAVENOUS
  Filled 2023-11-19: qty 1

## 2023-11-19 MED ORDER — HYDROMORPHONE HCL 1 MG/ML IJ SOLN
0.5000 mg | Freq: Once | INTRAMUSCULAR | Status: AC
  Administered 2023-11-19: 0.5 mg via INTRAVENOUS
  Filled 2023-11-19: qty 0.5

## 2023-11-19 MED ORDER — IOHEXOL 300 MG/ML  SOLN
100.0000 mL | Freq: Once | INTRAMUSCULAR | Status: AC | PRN
  Administered 2023-11-19: 100 mL via INTRAVENOUS

## 2023-11-19 MED ORDER — OXYCODONE-ACETAMINOPHEN 5-325 MG PO TABS: 1.0000 | ORAL_TABLET | ORAL | 0 refills | Status: DC | PRN

## 2023-11-19 NOTE — ED Triage Notes (Signed)
 Pt here after falling off of a scaffold estimated 10 ft. Pt  c/o pain all over. Pt denies LOC.

## 2023-11-19 NOTE — ED Provider Notes (Signed)
 [facility] Provider Note    Event Date/Time   First MD Initiated Contact with Patient 11/19/23 1507     (approximate)   History   Chief Complaint Fall   HPI  Curtis Anderson is a 53 y.o. male with past medical history of chronic back pain who presents to the ED complaining of fall.  Patient reports that he was working on scaffolding just prior to arrival when he lost his balance and fell about 10 feet, striking his right side on concrete.  He reports hitting the right side of his face and is not sure whether he lost consciousness, does report headache and feeling "woozy" since the fall.  He complains of pain in his right arm, right ankle, and chest.  He denies any difficulty breathing or pain in his abdomen.  He does not take any blood thinners.     Physical Exam   Triage Vital Signs: ED Triage Vitals [11/19/23 1409]  Encounter Vitals Group     BP (!) 165/111     Systolic BP Percentile      Diastolic BP Percentile      Pulse Rate 95     Resp (!) 25     Temp 98.1 F (36.7 C)     Temp Source Oral     SpO2 96 %     Weight 219 lb 2.2 oz (99.4 kg)     Height 5\' 9"  (1.753 m)     Head Circumference      Peak Flow      Pain Score 10     Pain Loc      Pain Education      Exclude from Growth Chart     Most recent vital signs: Vitals:   11/19/23 1500 11/19/23 1530  BP: (!) 157/95 (!) 171/124  Pulse: 88 83  Resp: 18 18  Temp:    SpO2: 96% 100%    Constitutional: Alert and oriented. Eyes: Conjunctivae are normal. Head: Atraumatic. Nose: No congestion/rhinnorhea. Mouth/Throat: Mucous membranes are moist.  Neck: No midline cervical spine tenderness to palpation. Cardiovascular: Normal rate, regular rhythm. Grossly normal heart sounds.  2+ radial and DP pulses bilaterally. Respiratory: Normal respiratory effort.  No retractions. Lungs CTAB.  No chest wall tenderness to palpation. Gastrointestinal: Soft and nontender. No  distention. Musculoskeletal: Diffuse tenderness to palpation of right shoulder and right elbow with no obvious deformity.  Tenderness to palpation noted over the ventral surface of both hands with no obvious deformity and no tenderness at either wrist.  Tenderness to palpation over medial right ankle and both knees diffusely.  No tenderness to palpation at left ankle and range of motion intact to both hips. Neurologic:  Normal speech and language. No gross focal neurologic deficits are appreciated.    ED Results / Procedures / Treatments   Labs (all labs ordered are listed, but only abnormal results are displayed) Labs Reviewed  COMPREHENSIVE METABOLIC PANEL WITH GFR - Abnormal; Notable for the following components:      Result Value   Creatinine, Ser 1.25 (*)    Total Protein 8.5 (*)    All other components within normal limits  CBC WITH DIFFERENTIAL/PLATELET     EKG  ED ECG REPORT I, Twilla Galea, the attending physician, personally viewed and interpreted this ECG.   Date: 11/19/2023  EKG Time: 14:12  Rate: 94  Rhythm: normal sinus rhythm  Axis: Normal  Intervals:none  ST&T Change: None  RADIOLOGY Right ankle x-ray  reviewed and interpreted by me with nondisplaced fracture of the medial malleolus.  PROCEDURES:  Critical Care performed: No  Procedures   MEDICATIONS ORDERED IN ED: Medications  HYDROmorphone  (DILAUDID ) injection 0.5 mg (0.5 mg Intravenous Given 11/19/23 1427)  ondansetron  (ZOFRAN ) injection 4 mg (4 mg Intravenous Given 11/19/23 1427)  Tdap (BOOSTRIX) injection 0.5 mL (0.5 mLs Intramuscular Given 11/19/23 1428)  morphine  (PF) 4 MG/ML injection 4 mg (4 mg Intravenous Given 11/19/23 1542)  iohexol (OMNIPAQUE) 300 MG/ML solution 100 mL (100 mLs Intravenous Contrast Given 11/19/23 1548)  oxyCODONE -acetaminophen  (PERCOCET/ROXICET) 5-325 MG per tablet 1 tablet (1 tablet Oral Given 11/19/23 1712)     IMPRESSION / MDM / ASSESSMENT AND PLAN / ED COURSE  I  reviewed the triage vital signs and the nursing notes.                              53 y.o. male with past medical history of chronic back pain who presents to the ED following fall 10 feet off of some scaffolding, struck his head and complains of headache, chest pain, right arm pain, and right ankle pain.  Patient's presentation is most consistent with acute presentation with potential threat to life or bodily function.  Differential diagnosis includes, but is not limited to, intracranial injury, cervical spine injury, rib fracture, hemothorax, pneumothorax, intra-abdominal injury, extremity fracture, dislocation.  Patient uncomfortable but nontoxic-appearing and in no acute distress, vital signs remarkable for hypertension but otherwise reassuring.  X-ray imaging of extremities is positive for fracture of his right medial malleolus as well as right radial head fracture.  Will place patient in sling as well as ankle splint.  He continues to have pain following 0.5 mg IV Dilaudid , will give 4 mg IV Zofran .  CT imaging of his head, neck, chest/abdomen/pelvis is pending at this time.  CT imaging is negative for acute traumatic injury, pain improved on reassessment after placement in splint for his ankle and sling for right arm.  He is appropriate for discharge home with outpatient orthopedic and podiatry follow-up, was counseled to return to the ED for new or worsening symptoms.  Patient agrees with plan.      FINAL CLINICAL IMPRESSION(S) / ED DIAGNOSES   Final diagnoses:  Fall, initial encounter  Closed nondisplaced fracture of head of right radius, initial encounter  Closed fracture of right ankle, initial encounter     Rx / DC Orders   ED Discharge Orders          Ordered    oxyCODONE -acetaminophen  (PERCOCET) 5-325 MG tablet  Every 4 hours PRN        11/19/23 1753             Note:  This document was prepared using Dragon voice recognition software and may include  unintentional dictation errors.   Twilla Galea, MD 11/19/23 1754

## 2023-11-19 NOTE — ED Provider Notes (Signed)
 HPI: Pt is a 53 y.o. male who presents with complaints of @CCN2 @.  The patient p/w fall 10 feet unable to ambulate secondary to right ankle pain.  Also reports right elbow pain.  Hit the right side of his face.  ROS: Denies fever, chest pain, vomiting  No past medical history on file. Vitals:   11/19/23 1409  BP: (!) 165/111  Pulse: 95  Resp: (!) 25  Temp: 98.1 F (36.7 C)  SpO2: 96%    Focused Physical Exam: Gen: No acute distress Head: atraumatic, normocephalic Eyes: Extraocular movements grossly intact; conjunctiva clear CV: RRR Patient has pain in the right ankle with swelling good distal pulse pain in the right elbow with good distal pulse.  Medical Decision Making and Plan: Given the patient's initial medical screening exam, the following diagnostic evaluation has been ordered. The patient will be placed in the appropriate treatment space, once one is available, to complete the evaluation and treatment. I have discussed the plan of care with the patient and I have advised the patient that an ED physician or mid-level practitioner will reevaluate their condition after the test results have been received, as the results may give them additional insight into the type of treatment they may need.   Diagnostics: Patient CTs were ordered, Dilaudid  for pain, x-rays for fracture  Treatments: dilaudid     Lubertha Rush, MD 11/19/23 1425

## 2023-11-25 ENCOUNTER — Ambulatory Visit: Payer: Medicaid Other | Admitting: Family Medicine

## 2023-11-26 ENCOUNTER — Ambulatory Visit: Admitting: Podiatry

## 2023-12-10 ENCOUNTER — Ambulatory Visit: Admitting: Sleep Medicine

## 2023-12-20 ENCOUNTER — Ambulatory Visit: Admitting: Family Medicine

## 2023-12-20 ENCOUNTER — Encounter: Payer: Self-pay | Admitting: Family Medicine

## 2023-12-20 ENCOUNTER — Ambulatory Visit: Payer: Self-pay

## 2023-12-20 VITALS — BP 129/73 | HR 85 | Temp 98.3°F | Ht 69.0 in | Wt 221.9 lb

## 2023-12-20 DIAGNOSIS — Z1211 Encounter for screening for malignant neoplasm of colon: Secondary | ICD-10-CM

## 2023-12-20 DIAGNOSIS — R7303 Prediabetes: Secondary | ICD-10-CM | POA: Diagnosis not present

## 2023-12-20 DIAGNOSIS — M545 Low back pain, unspecified: Secondary | ICD-10-CM | POA: Diagnosis not present

## 2023-12-20 DIAGNOSIS — R0683 Snoring: Secondary | ICD-10-CM

## 2023-12-20 DIAGNOSIS — B353 Tinea pedis: Secondary | ICD-10-CM

## 2023-12-20 DIAGNOSIS — R200 Anesthesia of skin: Secondary | ICD-10-CM

## 2023-12-20 DIAGNOSIS — R202 Paresthesia of skin: Secondary | ICD-10-CM

## 2023-12-20 DIAGNOSIS — G8929 Other chronic pain: Secondary | ICD-10-CM

## 2023-12-20 DIAGNOSIS — Z23 Encounter for immunization: Secondary | ICD-10-CM | POA: Diagnosis not present

## 2023-12-20 DIAGNOSIS — F321 Major depressive disorder, single episode, moderate: Secondary | ICD-10-CM | POA: Diagnosis not present

## 2023-12-20 MED ORDER — BUPROPION HCL ER (XL) 150 MG PO TB24: 150.0000 mg | ORAL_TABLET | Freq: Every day | ORAL | 2 refills | Status: DC

## 2023-12-20 MED ORDER — MELOXICAM 15 MG PO TABS: 15.0000 mg | ORAL_TABLET | Freq: Every day | ORAL | 0 refills | Status: DC

## 2023-12-20 MED ORDER — GABAPENTIN 100 MG PO CAPS: 100.0000 mg | ORAL_CAPSULE | Freq: Three times a day (TID) | ORAL | 3 refills | Status: DC

## 2023-12-20 NOTE — Telephone Encounter (Signed)
 Patient had a accident where he broke his elbow and his ankle. And now he's having problems in his back. Has been seeing orthopedic for elbow and ankle. Wants to talk with pcp about back. Accident was 3 weeks ago he says his back problem has gotten worse.

## 2023-12-20 NOTE — Telephone Encounter (Signed)
 FYI Only or Action Required?: FYI only for provider.  Patient was last seen in primary care on 07/25/2023 by Wellington Curtis LABOR, FNP.  Called Nurse Triage reporting Back Pain.  Symptoms began a week ago.  Interventions attempted: Nothing.  Symptoms are: gradually worsening.  Triage Disposition: See PCP When Office is Open (Within 3 Days) scheduled   Patient/caregiver understands and will follow disposition?: Yes      Reason for Disposition  [1] MODERATE back pain (e.g., interferes with normal activities) AND [2] present > 3 days  Answer Assessment - Initial Assessment Questions 1. ONSET: When did the pain begin? (e.g., minutes, hours, days)     -------------------- 1. 5 week ago   2. LOCATION: Where does it hurt? (upper, mid or lower back)     -------Lower part  3. SEVERITY: How bad is the pain?  (e.g., Scale 1-10; mild, moderate, or severe)     -------------------------5/10    4. PATTERN: Is the pain constant? (e.g., yes, no; constant, intermittent)      ------------------- Intermittent: When attempting to complete his daily activities.   5. RADIATION: Does the pain shoot into your legs or somewhere else?     ----------------------------Tingling sensation bilateral arms: intermittent    6. CAUSE:  What do you think is causing the back pain?      ---------------- Clemens off a scalpel 3 weeks ago. Over 12 foot fall.   7. BACK OVERUSE:  Any recent lifting of heavy objects, strenuous work or exercise?     ------------------------------ Denies   8. MEDICINES: What have you taken so far for the pain? (e.g., nothing, acetaminophen , NSAIDS)     -------------------------- Yes, oxycodone .   9. NEUROLOGIC SYMPTOMS: Do you have any weakness, numbness, or problems with bowel/bladder control?     ------------------------------ Denies new onset concerns. Does note, prior to fall he as peeing frequently.   10. OTHER SYMPTOMS: Do you have any other symptoms?  (e.g., fever, abdomen pain, burning with urination, blood in urine)       ---------Denies    Additional Information:  Protocols used: Back Pain-A-AH

## 2023-12-20 NOTE — Progress Notes (Signed)
 "  Established Patient Office Visit  Introduced to nurse practitioner role and practice setting.  All questions answered.  Discussed provider/patient relationship and expectations.   Subjective   Patient ID: Curtis Anderson, male    DOB: 02-15-1971  Age: 53 y.o. MRN: [id number]  Chief Complaint  Patient presents with   Acute Visit    Patient is having pain in his lower back also in both arms.  In the left arm and hand tingling radiating to back.  Clemens off a scaffold at work about 3 weeks ago has been having pain in his back and hands ever since.    Prostate and Kidney Function Labs  Colonoscopy- ok to order  Pneumococcal Vaccine-yes Shingles Vaccine-yes Hepatitis B-yes    Discussed the use of AI scribe software for clinical note transcription with the patient, who gave verbal consent to proceed.  History of Present Illness Curtis Anderson is a 53 year old male who presents with back pain and tingling in his hands following a fall.  He experienced back pain and tingling in his hands following a fall approximately one month ago, during which he fractured his ankle and elbow. The back pain is located in the middle of the small of his back and worsens with prolonged standing. Relief is found by bending over and stretching. No neck pain is reported, but there is some pain when twisting, especially in the morning.  He experiences tingling in his hands and palms, which intermittently impairs his ability to grip objects due to the sensation. The tingling sometimes radiates from his arm to his back and worsens when he hits his thumb, causing a shooting sensation.  He also reports tension in his lower back when standing, described as 'flaring up'.  He has a history of back problems. He has not been taking any pain medication regularly due to increased urination when using oxycodone . He reports frequent urination, especially at night.  He has not yet received his CPAP machine. He has been using  iodine on his feet as advised by a podiatrist, but the treatment was ineffective after initial improvement. He missed a follow-up appointment due to having a cast on his foot.  He has a family history of diabetes and his last A1c was 6.1, indicating prediabetes.  Informed prostate levels in they were normal as of July 25, 2023.        12/20/2023    1:17 PM 07/25/2023    3:04 PM  Depression screen PHQ 2/9  Decreased Interest 2 3  Down, Depressed, Hopeless 2 0  PHQ - 2 Score 4 3  Altered sleeping 2 3  Tired, decreased energy 2 3  Change in appetite 2 3  Feeling bad or failure about yourself  2 0  Trouble concentrating 2 2  Moving slowly or fidgety/restless 1 0  Suicidal thoughts 0 0  PHQ-9 Score 15 14  Difficult doing work/chores Very difficult        12/20/2023    1:17 PM 07/25/2023    3:04 PM  GAD 7 : Generalized Anxiety Score  Nervous, Anxious, on Edge 1 0  Control/stop worrying 2 2  Worry too much - different things 2 2  Trouble relaxing 2 0  Restless 2 0  Easily annoyed or irritable 2 2  Afraid - awful might happen 2 0  Total GAD 7 Score 13 6  Anxiety Difficulty Very difficult Somewhat difficult     Review of Systems  All other systems reviewed and  are negative.   Negative unless indicated in HPI   Objective:     BP 129/73 (BP Location: Left Arm, Patient Position: Sitting, Cuff Size: Normal)   Pulse 85   Temp 98.3 F (36.8 C) (Oral)   Ht 5' 9 (1.753 m)   Wt 221 lb 14.4 oz (100.7 kg)   SpO2 97%   BMI 32.77 kg/m    Physical Exam Constitutional:      General: He is not in acute distress.    Appearance: Normal appearance. He is obese. He is not ill-appearing, toxic-appearing or diaphoretic.  HENT:     Head: Normocephalic.     Nose: Nose normal.     Mouth/Throat:     Mouth: Mucous membranes are moist.  Eyes:     Extraocular Movements: Extraocular movements intact.     Conjunctiva/sclera: Conjunctivae normal.     Pupils: Pupils are equal, round,  and reactive to light.  Neck:     Vascular: No carotid bruit.  Cardiovascular:     Rate and Rhythm: Normal rate and regular rhythm.     Heart sounds: No murmur heard.    No friction rub. No gallop.  Pulmonary:     Effort: Pulmonary effort is normal. No respiratory distress.     Breath sounds: Normal breath sounds. No stridor. No wheezing, rhonchi or rales.  Chest:     Chest wall: No tenderness.  Musculoskeletal:        General: Signs of injury present.     Right wrist: Normal.     Left wrist: Normal.     Right hand: Normal.     Left hand: Normal.     Cervical back: Normal.     Thoracic back: Normal.     Lumbar back: Normal.     Left lower leg: No edema.     Comments: Cast from toes to knee for R ankle fracture. Using scooter for ambulation  Grip strength 5/5  Positive Phalen's test  Lymphadenopathy:     Cervical: No cervical adenopathy.  Skin:    General: Skin is warm and dry.     Capillary Refill: Capillary refill takes less than 2 seconds.  Neurological:     General: No focal deficit present.     Mental Status: He is alert and oriented to person, place, and time. Mental status is at baseline.     Cranial Nerves: No cranial nerve deficit.     Sensory: No sensory deficit.     Motor: No weakness.     Coordination: Coordination normal.     Gait: Gait normal.  Psychiatric:        Attention and Perception: Attention and perception normal.        Mood and Affect: Affect normal. Mood is depressed.        Speech: Speech normal.        Behavior: Behavior is cooperative.        Thought Content: Thought content normal.        Cognition and Memory: Cognition and memory normal.        Judgment: Judgment normal.      Results for orders placed or performed in visit on 12/20/23  Comprehensive metabolic panel with GFR  Result Value Ref Range   Glucose 104 (H) 70 - 99 mg/dL   BUN 14 6 - 24 mg/dL   Creatinine, Ser 9.00 0.76 - 1.27 mg/dL   eGFR 92 >40 fO/fpw/8.26    BUN/Creatinine Ratio 14 9 - 20  Sodium 142 134 - 144 mmol/L   Potassium 4.2 3.5 - 5.2 mmol/L   Chloride 104 96 - 106 mmol/L   CO2 20 20 - 29 mmol/L   Calcium 9.0 8.7 - 10.2 mg/dL   Total Protein 7.6 6.0 - 8.5 g/dL   Albumin 3.9 3.8 - 4.9 g/dL   Globulin, Total 3.7 1.5 - 4.5 g/dL   Bilirubin Total 0.3 0.0 - 1.2 mg/dL   Alkaline Phosphatase 142 (H) 44 - 121 IU/L   AST 20 0 - 40 IU/L   ALT 35 0 - 44 IU/L  Hemoglobin A1c  Result Value Ref Range   Hgb A1c MFr Bld 5.7 (H) 4.8 - 5.6 %   Est. average glucose Bld gHb Est-mCnc 117 mg/dL      The ASCVD Risk score (Arnett DK, et al., 2019) failed to calculate for the following reasons:   Cannot find a previous HDL lab   Cannot find a previous total cholesterol lab    Assessment & Plan:  Screening for colon cancer -     Ambulatory referral to Gastroenterology  Prediabetes -     Comprehensive metabolic panel with GFR -     Hemoglobin A1c  Chronic bilateral low back pain without sciatica -     Meloxicam ; Take 1 tablet (15 mg total) by mouth daily.  Dispense: 30 tablet; Refill: 0 -     DG Lumbar Spine Complete; Future -     DG Sacrum/Coccyx; Future  Numbness and tingling in both hands -     Gabapentin ; Take 1 capsule (100 mg total) by mouth 3 (three) times daily.  Dispense: 90 capsule; Refill: 3  Depression, major, single episode, moderate (HCC) -     buPROPion  HCl ER (XL); Take 1 tablet (150 mg total) by mouth daily.  Dispense: 30 tablet; Refill: 2  Need for hepatitis B vaccination -     Heplisav-B  (HepB-CPG) Vaccine  Need for shingles vaccine -     Varicella-zoster vaccine IM  Need for pneumococcal vaccine -     Pneumococcal conjugate vaccine 20-valent  Loud snoring  Tinea pedis of both feet     Assessment and Plan Assessment & Plan Ankle and Elbow Fracture Fractures with potential nerve involvement. Continued casting advised for bone alignment. Surgery considered if healing is inadequate per pt - seeing ortho at  [facility]. - Continue with current cast for three weeks as advised by orthopedic specialist. - Follow up with orthopedic specialist next Thursday to assess healing and determine if surgery is necessary.  Back Pain Chronic lower back pain exacerbated by fall. No acute findings on imaging. Possible nerve involvement. Gabapentin  considered for neuropathic pain. - Order x-ray of the lower back - Refer to [facility] for further evaluation of back pain. - Prescribe gabapentin  for neuropathic pain, starting with nighttime dosing due to potential drowsiness. - Prescribe meloxicam  for back pain, (do not take other nsaids concurrently) - Recommend heat, ice, and stretching exercises for pain management. - Consider referral to neurology if numbness and tingling persist after two months.  Neuropathy Experiencing numbness and tingling in hands C spine CT - no acute findings, mild degenerative changes at c50-c6  could attribute to the tingling Last A1c = 6.1 vs carpal tunnel syndrome (positive Phalen test).  Trial of Gabapentin  for neuropathic pain. - Recommend using frozen water bottles for wrist exercises and over-the-counter wrist braces at night. - Referral to ortho at [facility] for back, may need neurology work up.   Prediabetes Previous  A1c of 6.1  - Recheck A1c to assess current status. - Advise on dietary modifications to control blood sugar, including reducing intake of breads, pastas, rice, sugary drinks, and processed foods. - Increase exercise as tolerated with current injury  Depression PHQ-9= 15  Open to starting a daily maintenance medication for mood. - Prescribe Wellbutrin  150mg  daily  General Health Maintenance Discussion of vaccines and screenings.  -okay to receive Hep B, Shingles and pneumococcal. - Screening Colonoscopy ordered  Chronic tinea pedis -Follow up with podiatry on further mgmt, previous two appointment pt canceled  Snoring - Sees Dr. Jess at [place]  - sleep  study postiive for OSA - obtain cpap  Follow-up  -Referral to [facility] is active and schedule an appointment. - Recheck A1c and kidney function with labs. - Obtain CPAP - F/u with podiatry on super infection of foot.   Return in about 4 weeks (around 01/17/2024) for mood virtual.  ANDA, Curtis DELENA Boom, FNP, have reviewed all documentation for this visit. The documentation on 12/23/23 for the exam, diagnosis, procedures, and orders are all accurate and complete.   Curtis DELENA Boom, FNP "

## 2023-12-21 ENCOUNTER — Ambulatory Visit: Payer: Self-pay | Admitting: Family Medicine

## 2023-12-21 LAB — COMPREHENSIVE METABOLIC PANEL WITH GFR
ALT: 35 IU/L (ref 0–44)
AST: 20 IU/L (ref 0–40)
Albumin: 3.9 g/dL (ref 3.8–4.9)
Alkaline Phosphatase: 142 IU/L — ABNORMAL HIGH (ref 44–121)
BUN/Creatinine Ratio: 14 (ref 9–20)
BUN: 14 mg/dL (ref 6–24)
Bilirubin Total: 0.3 mg/dL (ref 0.0–1.2)
CO2: 20 mmol/L (ref 20–29)
Calcium: 9 mg/dL (ref 8.7–10.2)
Chloride: 104 mmol/L (ref 96–106)
Creatinine, Ser: 0.99 mg/dL (ref 0.76–1.27)
Globulin, Total: 3.7 g/dL (ref 1.5–4.5)
Glucose: 104 mg/dL — ABNORMAL HIGH (ref 70–99)
Potassium: 4.2 mmol/L (ref 3.5–5.2)
Sodium: 142 mmol/L (ref 134–144)
Total Protein: 7.6 g/dL (ref 6.0–8.5)
eGFR: 92 mL/min/1.73 (ref >59)

## 2023-12-21 LAB — HEMOGLOBIN A1C
Est. average glucose Bld gHb Est-mCnc: 117 mg/dL
Hgb A1c MFr Bld: 5.7 % — ABNORMAL HIGH (ref 4.8–5.6)

## 2023-12-23 ENCOUNTER — Encounter: Payer: Self-pay | Admitting: Family Medicine

## 2024-01-07 ENCOUNTER — Ambulatory Visit: Admitting: Podiatry

## 2024-01-21 ENCOUNTER — Telehealth (INDEPENDENT_AMBULATORY_CARE_PROVIDER_SITE_OTHER): Payer: Self-pay | Admitting: Family Medicine

## 2024-01-21 DIAGNOSIS — Z91199 Patient's noncompliance with other medical treatment and regimen due to unspecified reason: Secondary | ICD-10-CM

## 2024-01-21 NOTE — Progress Notes (Signed)
 Pt no showed for appt. Chart reviewed prior to discussed medication management for pt. Link sent for video visit login. Pt did not show.

## 2024-01-23 ENCOUNTER — Ambulatory Visit: Admitting: Podiatry

## 2024-02-20 IMAGING — CT CT L SPINE W/O CM
3 of 5 series · 13 of 33 positions shown, 15 images · non-contrast
Comparison: None.

CLINICAL DATA: Lower back pain on the left with frequent urination.



[Series 1: l-spine st · axial · 0.29mm/px · z∈[-833,-577]mm · 5 of 193 slices shown, 7 images]
[im 33/193  soft-tissue]
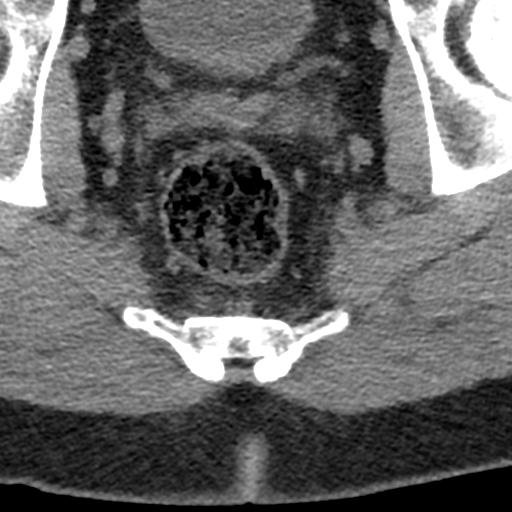
[im 33/193  bone]
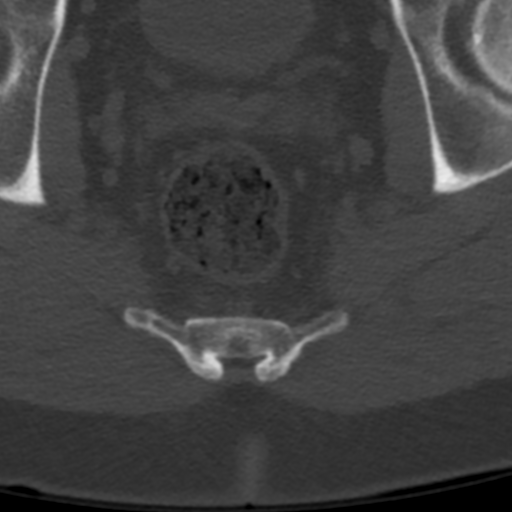
[im 65/193  bone]
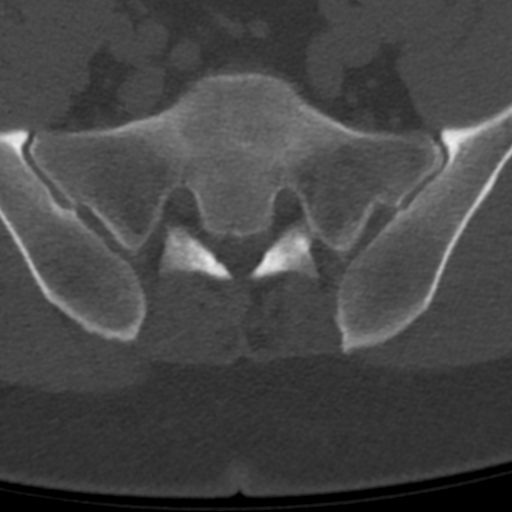
[im 97/193  bone]
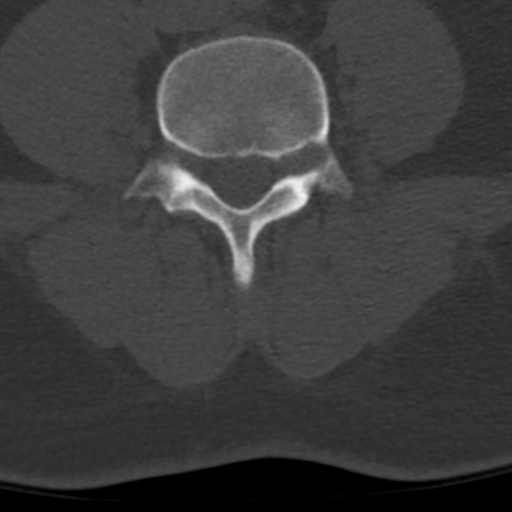
[im 129/193  bone]
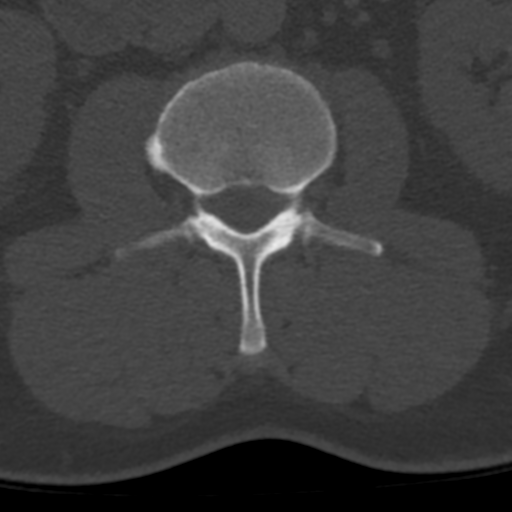
[im 161/193  soft-tissue]
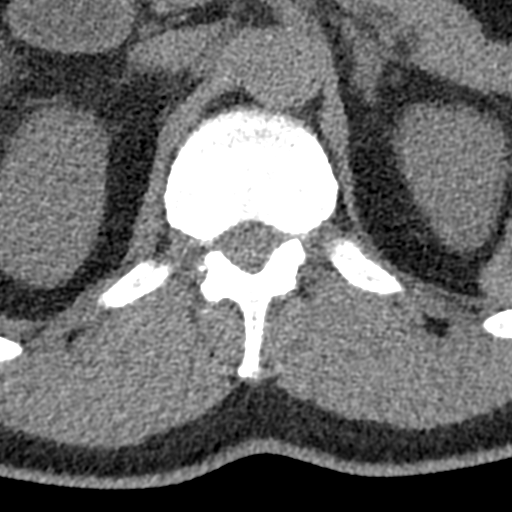
[im 161/193  bone]
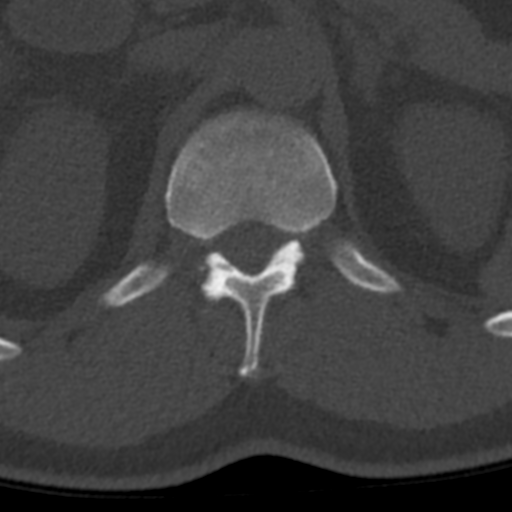

[Series 3: l-spine bone · axial · 0.29mm/px · z∈[-833,-577]mm · 5 of 193 slices shown]
[im 33/193  bone]
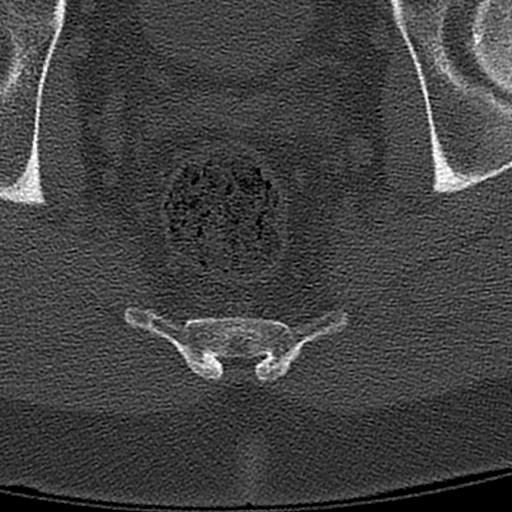
[im 65/193  bone]
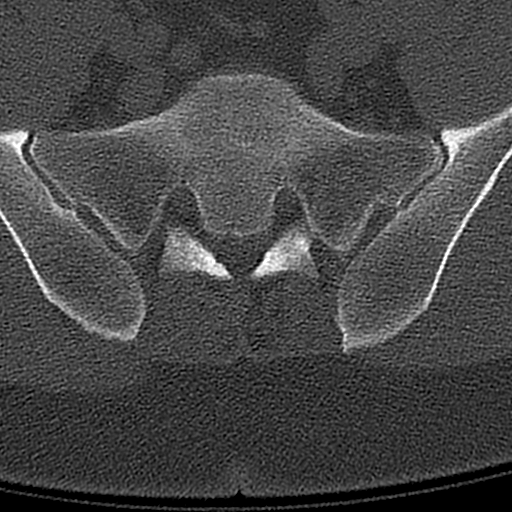
[im 97/193  bone]
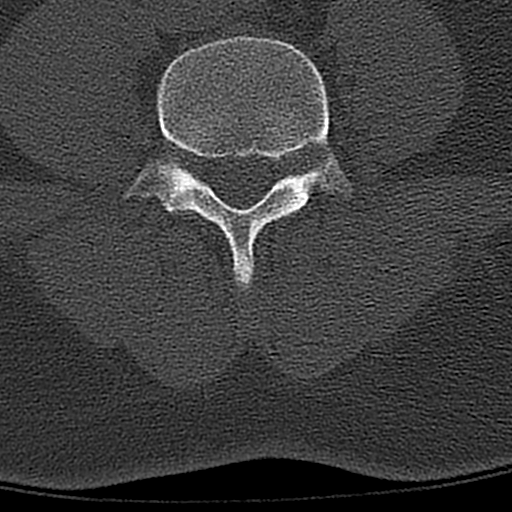
[im 129/193  bone]
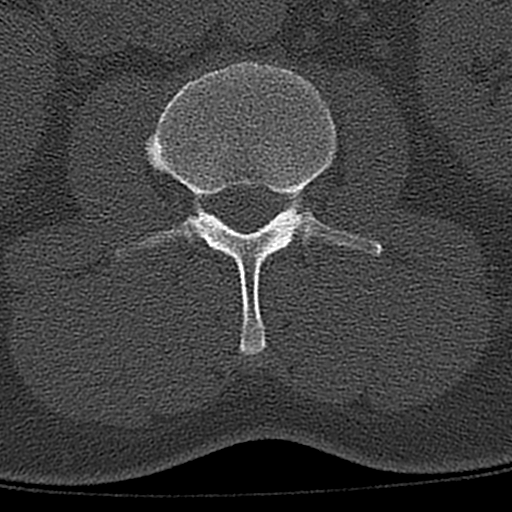
[im 161/193  bone]
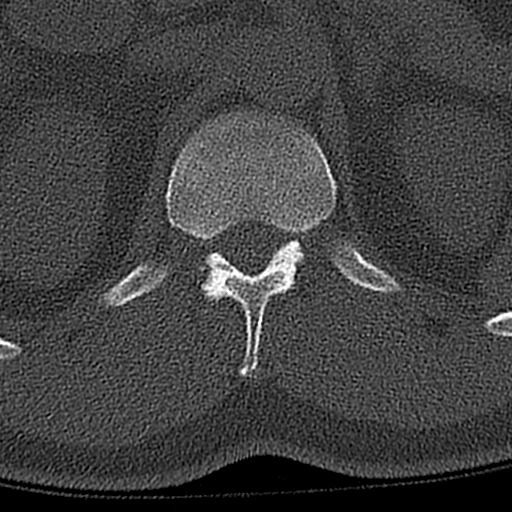

[Series 4: l-spine coronal · coronal · 0.35mm/px · 3 of 71 slices shown]
[im 15/71  bone]
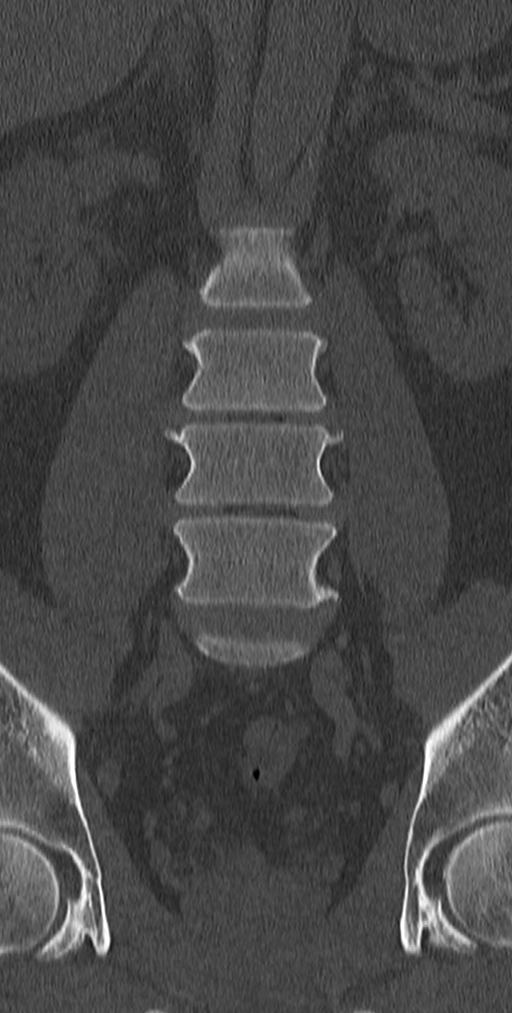
[im 29/71  bone]
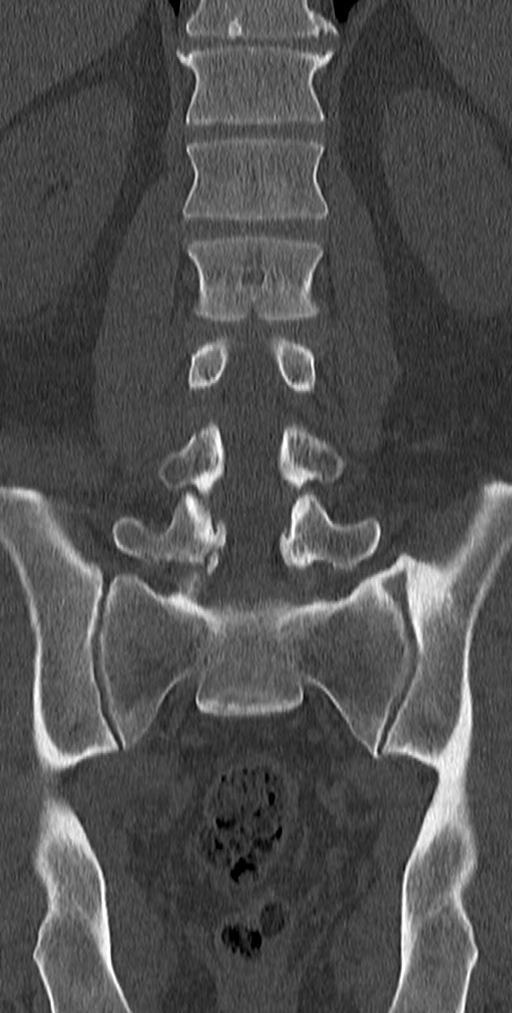
[im 43/71  bone]
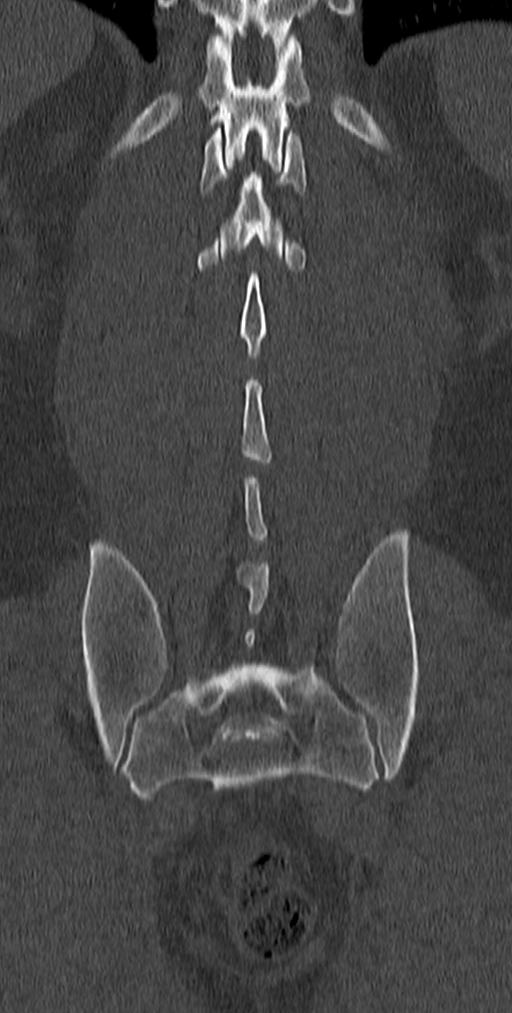

[13 of 33 positions shown; findings below may reference images not displayed]

FINDINGS: Segmentation: 5 lumbar type vertebrae.

Alignment: Normal.

Vertebrae: No acute fracture or focal pathologic process.

Paraspinal and other soft tissues: Negative.

Disc levels:

T10-11 right foraminal impingement from protrusion and spurring,
only covered on axial images.

T11-12: Disc space narrowing and mainly ventral spurring. Patent
canal and foramina

T12- L1: Bulky asymmetric left facet spurring and high-grade left
foraminal stenosis.

L1-L2: Unremarkable.

L2-L3: No bony impingement, but question left foraminal herniation
based on axial images where there is no visible perineural fat.

L3-L4: Disc narrowing and bulging with endplate spurring. Bilateral
foraminal stenosis, advanced on the right

L4-L5: Disc narrowing and bulging with gas containing disc fissure.
Degenerative endplate spurring. Negative facets. Advanced left
foraminal impingement

L5-S1:Disc narrowing and bulging. Mild facet spurring. Chronic
bilateral L5 pars defects.
IMPRESSION: 1. No acute finding.
2. Lumbar spine degeneration with multilevel foraminal impingement
as listed above.
3. L5 chronic pars defects without anterolisthesis.

## 2024-02-20 IMAGING — CT CT RENAL STONE PROTOCOL
2 of 4 series · 16 of 46 positions shown, 18 images · non-contrast
Comparison: None.

CLINICAL DATA: Flank pain with kidney stone suspected



[Series 2: stone full standard · axial · 0.73mm/px · z∈[-930,-450]mm · 13 of 106 slices shown, 15 images]
[im 5/106  soft-tissue]
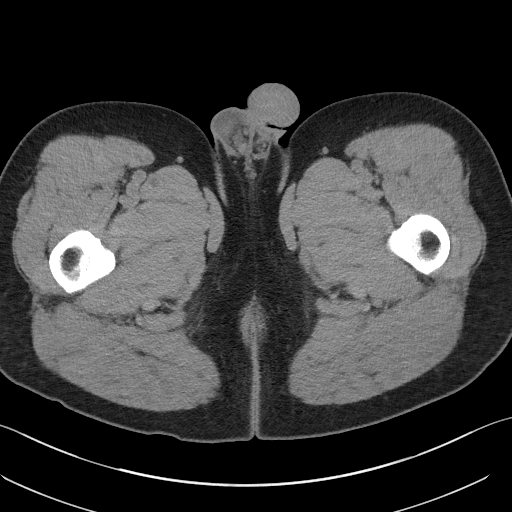
[im 5/106  bone]
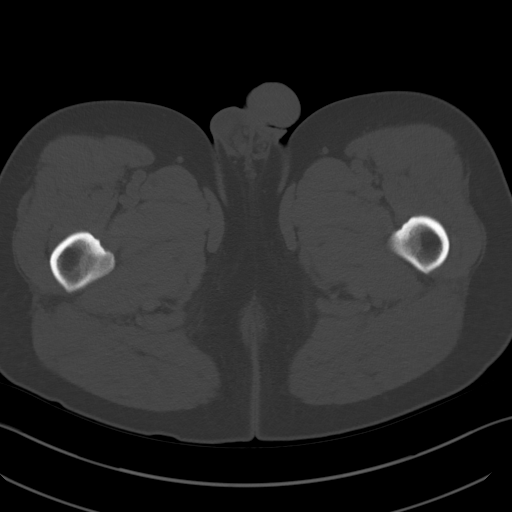
[im 14/106  soft-tissue]
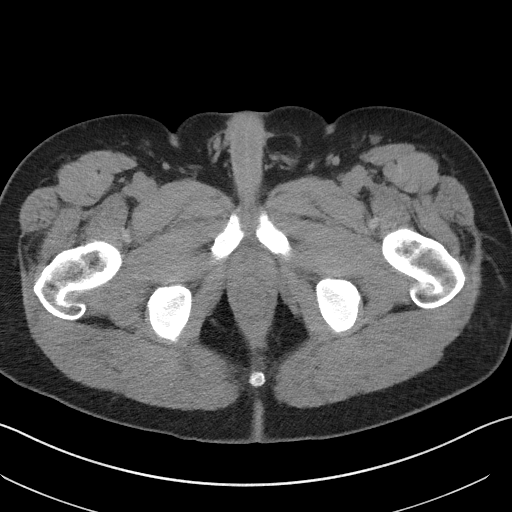
[im 22/106  soft-tissue]
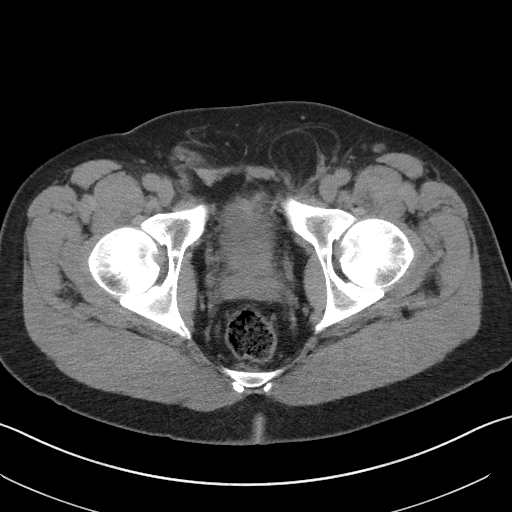
[im 31/106  soft-tissue]
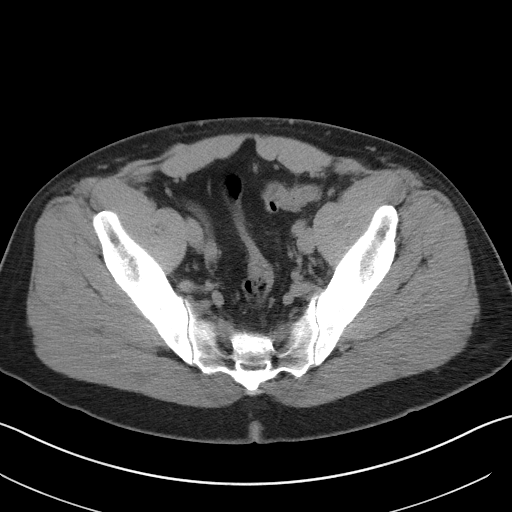
[im 36/106  soft-tissue]
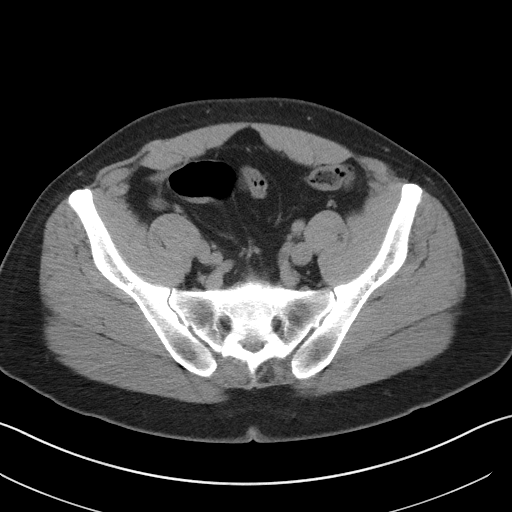
[im 44/106  soft-tissue]
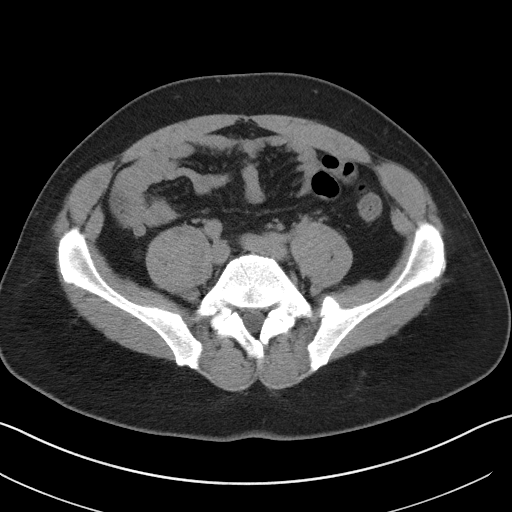
[im 53/106  soft-tissue]
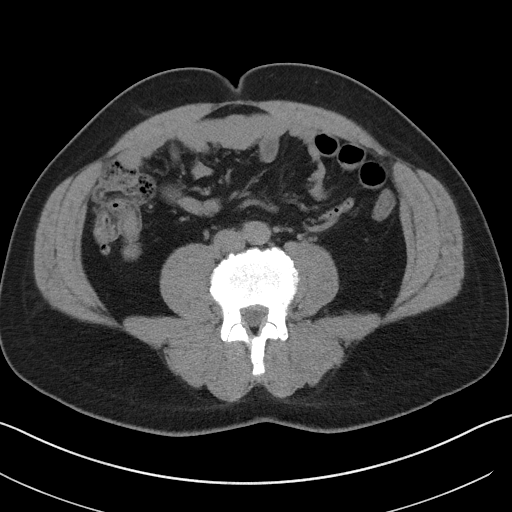
[im 62/106  soft-tissue]
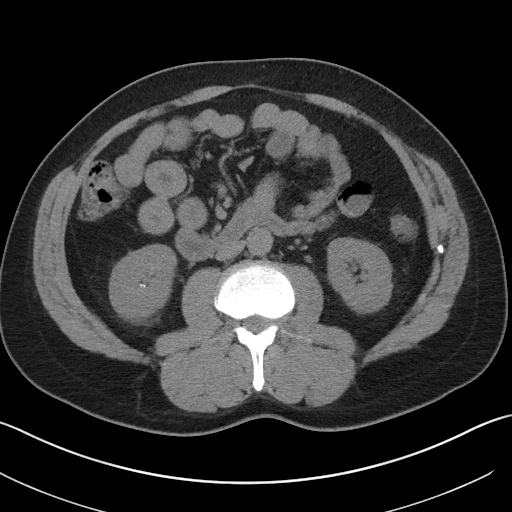
[im 71/106  soft-tissue]
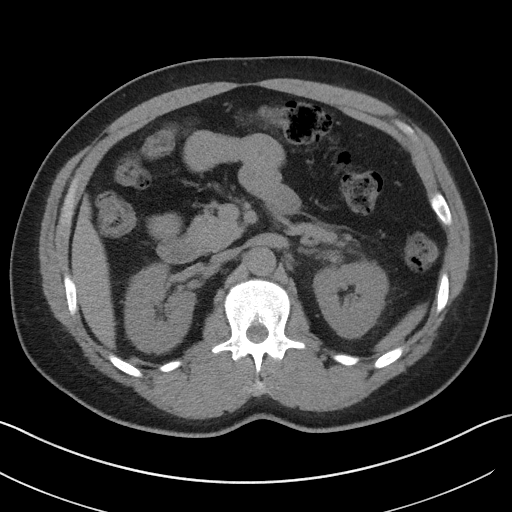
[im 71/106  bone]
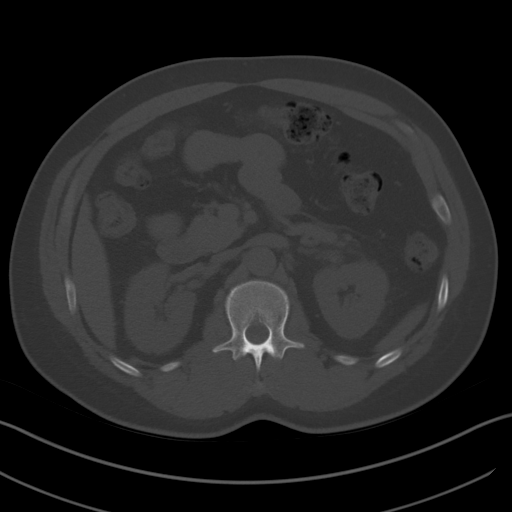
[im 75/106  soft-tissue]
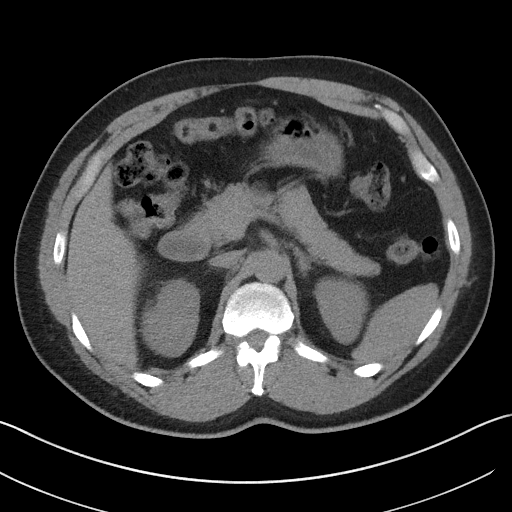
[im 84/106  soft-tissue]
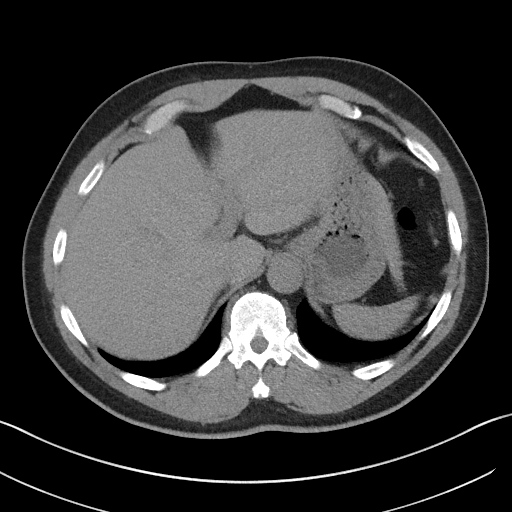
[im 92/106  soft-tissue]
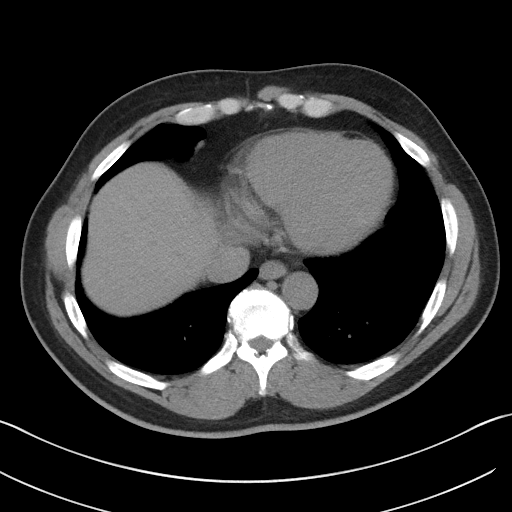
[im 101/106  soft-tissue]
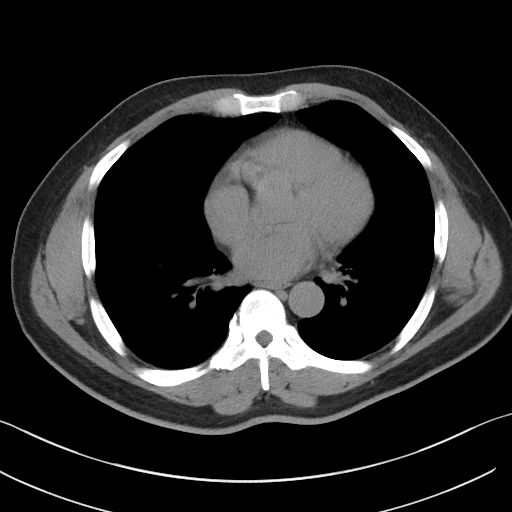

[Series 5: coronal · coronal · 0.80mm/px · 3 of 144 slices shown]
[im 48/144  soft-tissue]
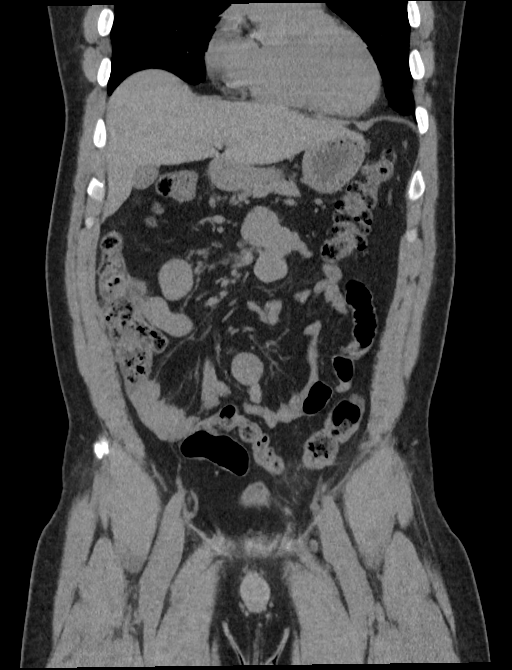
[im 64/144  soft-tissue]
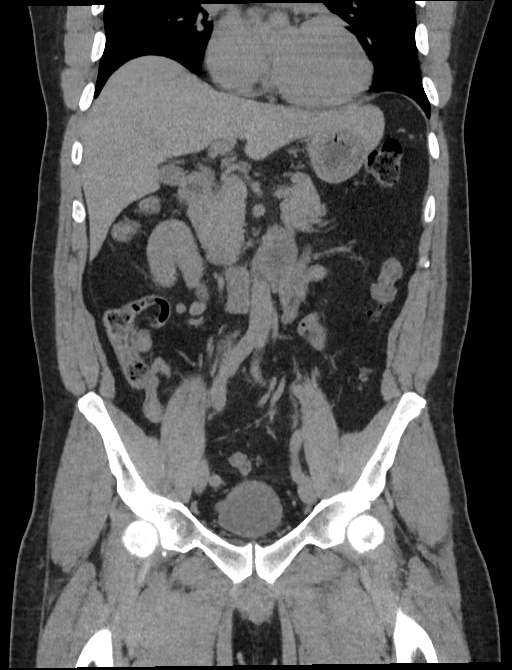
[im 80/144  soft-tissue]
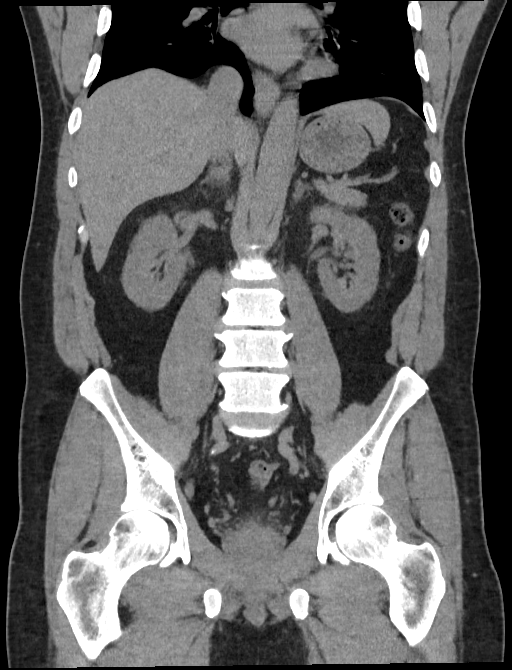

[16 of 46 positions shown; findings below may reference images not displayed]

FINDINGS: Lower chest:  No contributory findings.

Hepatobiliary: No focal liver abnormality.No evidence of biliary
obstruction or stone.

Pancreas: Unremarkable.

Spleen: Unremarkable.

Adrenals/Urinary Tract: Negative adrenals. No hydronephrosis or
ureteral stone. 2 small calculi at the lower pole right kidney.
Unremarkable bladder.

Stomach/Bowel:  No obstruction. No appendicitis.

Vascular/Lymphatic: No acute vascular abnormality. No mass or
adenopathy.

Reproductive:No pathologic findings.

Other: No ascites or pneumoperitoneum. Fatty left groin hernia.
Suspect prior right inguinal hernia repair using mesh.

Musculoskeletal: Lumbar spine degeneration with foraminal narrowings
described on dedicated reformats.
IMPRESSION: 1. No acute finding.  No hydronephrosis or ureteral calculus.
2. Small right renal calculi.
3. Fatty left groin hernia

## 2024-03-17 ENCOUNTER — Ambulatory Visit: Admitting: Podiatry

## 2024-03-17 DIAGNOSIS — B351 Tinea unguium: Secondary | ICD-10-CM | POA: Diagnosis not present

## 2024-03-17 DIAGNOSIS — M216X2 Other acquired deformities of left foot: Secondary | ICD-10-CM

## 2024-03-17 DIAGNOSIS — M216X1 Other acquired deformities of right foot: Secondary | ICD-10-CM | POA: Diagnosis not present

## 2024-03-17 DIAGNOSIS — B999 Unspecified infectious disease: Secondary | ICD-10-CM | POA: Diagnosis not present

## 2024-03-17 MED ORDER — CASTELLANI PAINT 1.5 % EX LIQD: 1.0000 mL | Freq: Every day | CUTANEOUS | 3 refills | Status: DC

## 2024-03-17 NOTE — Progress Notes (Signed)
 Bilateral foot deformity orthotics  Castellani paint  Subjective:  Patient ID: Curtis Anderson, male    DOB: 02-04-1971,  MRN: [id number]  Chief Complaint  Patient presents with   Superinfection    Pt stated that he is still having issues with his skin cracking and peeling in between his toes he stated that they itch and burn all the time     53 y.o. male presents with the above complaint.  Patient presents for follow-up of bilateral 2nd through 4th interdigital space maceration he states is still the same issues no acute complaints burns all the time he would like to discuss next treatment plan is tried the oral medication which did not help   Review of Systems: Negative except as noted in the HPI. Denies N/V/F/Ch.  Past Medical History:  Diagnosis Date   Hypertension    Sleep apnea     Current Outpatient Medications:    [employer]ellani Paint 1.5 % LIQD, Apply 1 mL topically daily., Disp: 29.57 mL, Rfl: 3   buPROPion  (WELLBUTRIN  XL) 150 MG 24 hr tablet, Take 1 tablet (150 mg total) by mouth daily., Disp: 30 tablet, Rfl: 2   doxycycline  (VIBRA -TABS) 100 MG tablet, Take 1 tablet (100 mg total) by mouth 2 (two) times daily. (Patient not taking: Reported on 12/20/2023), Disp: 28 tablet, Rfl: 0   gabapentin  (NEURONTIN ) 100 MG capsule, Take 1 capsule (100 mg total) by mouth 3 (three) times daily., Disp: 90 capsule, Rfl: 3   meloxicam  (MOBIC ) 15 MG tablet, Take 1 tablet (15 mg total) by mouth daily., Disp: 30 tablet, Rfl: 0   oxyCODONE -acetaminophen  (PERCOCET) 5-325 MG tablet, Take 1 tablet by mouth every 4 (four) hours as needed. (Patient not taking: Reported on 12/20/2023), Disp: 12 tablet, Rfl: 0   terbinafine  (LAMISIL ) 250 MG tablet, Take 1 tablet (250 mg total) by mouth daily. (Patient not taking: Reported on 12/20/2023), Disp: 30 tablet, Rfl: 0  Social History   Tobacco Use  Smoking Status Every Day   Current packs/day: 0.50   Types: Cigarettes  Smokeless Tobacco Never    No Known  Allergies Objective:  There were no vitals filed for this visit. There is no height or weight on file to calculate BMI. Constitutional Well developed. Well nourished.  Vascular Dorsalis pedis pulses palpable bilaterally. Posterior tibial pulses palpable bilaterally. Capillary refill normal to all digits.  No cyanosis or clubbing noted. Pedal hair growth normal.  Neurologic Normal speech. Oriented to person, place, and time. Epicritic sensation to light touch grossly present bilaterally.  Dermatologic Bilateral second third fourth interdigital space maceration with superinfection.  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  Plan:  Patient was evaluated and treated and all questions answered.  Bilateral superinfection of second third fourth interdigital space -All questions and concerns were discussed with the patient in extensive detail - Clinically patient states that he Lamisil  and doxycycline  was not helpful.  He has been doing both of that.  He would like to discuss next treatment plan - Patient will benefit from [facility] paint to see how removed some of the macerated skin.  I encourage both socks as well as foot powder he states understanding he is working on that already.   Pes planovalgus/foot deformity -I explained  to patient the etiology of pes planovalgus and relationship with heel pain/arch pain and various treatment options were discussed.  Given patient foot structure in the setting of heel pain/arch pain I believe patient will benefit from custom-made orthotics to help control the hindfoot motion support the arch of the foot and take the stress away from arches.  Patient agrees with the plan like to proceed with  orthotics -Patient was casted for orthotics   No follow-ups on file.

## 2024-04-13 ENCOUNTER — Ambulatory Visit

## 2024-04-13 DIAGNOSIS — D125 Benign neoplasm of sigmoid colon: Secondary | ICD-10-CM | POA: Diagnosis not present

## 2024-04-13 DIAGNOSIS — K640 First degree hemorrhoids: Secondary | ICD-10-CM | POA: Diagnosis not present

## 2024-04-13 DIAGNOSIS — D123 Benign neoplasm of transverse colon: Secondary | ICD-10-CM | POA: Diagnosis not present

## 2024-04-13 DIAGNOSIS — Z1211 Encounter for screening for malignant neoplasm of colon: Secondary | ICD-10-CM | POA: Diagnosis not present

## 2024-04-24 ENCOUNTER — Ambulatory Visit: Admitting: Podiatry

## 2024-04-24 DIAGNOSIS — L0889 Other specified local infections of the skin and subcutaneous tissue: Secondary | ICD-10-CM

## 2024-04-24 DIAGNOSIS — B351 Tinea unguium: Secondary | ICD-10-CM | POA: Diagnosis not present

## 2024-04-24 MED ORDER — HYDROCODONE-ACETAMINOPHEN 5-325 MG PO TABS: 1.0000 | ORAL_TABLET | Freq: Four times a day (QID) | ORAL | 0 refills | Status: DC | PRN

## 2024-04-24 MED ORDER — TERBINAFINE HCL 250 MG PO TABS: 250.0000 mg | ORAL_TABLET | Freq: Every day | ORAL | 0 refills | Status: DC

## 2024-04-24 NOTE — Progress Notes (Signed)
 Bilateral foot deformity orthotics  Castellani paint  Subjective:  Patient ID: Curtis Anderson, male    DOB: Jul 23, 1970,  MRN: [id number]  Chief Complaint  Patient presents with   Superinfection    53 y.o. male presents with the above complaint.  Patient presents for follow-up of bilateral 2nd through 4th interdigital space maceration he states is still the same issues no acute complaints burns all the time he would like to discuss next treatment plan is tried the oral medication which did not help   Review of Systems: Negative except as noted in the HPI. Denies N/V/F/Ch.  Past Medical History:  Diagnosis Date   Hypertension    Sleep apnea     Current Outpatient Medications:    HYDROcodone -acetaminophen  (NORCO/VICODIN) 5-325 MG tablet, Take 1 tablet by mouth every 6 (six) hours as needed., Disp: 30 tablet, Rfl: 0   terbinafine  (LAMISIL ) 250 MG tablet, Take 1 tablet (250 mg total) by mouth daily., Disp: 30 tablet, Rfl: 0   buPROPion  (WELLBUTRIN  XL) 150 MG 24 hr tablet, Take 1 tablet (150 mg total) by mouth daily., Disp: 30 tablet, Rfl: 2   Castellani Paint 1.5 % LIQD, Apply 1 mL topically daily., Disp: 29.57 mL, Rfl: 3   doxycycline  (VIBRA -TABS) 100 MG tablet, Take 1 tablet (100 mg total) by mouth 2 (two) times daily. (Patient not taking: Reported on 12/20/2023), Disp: 28 tablet, Rfl: 0   gabapentin  (NEURONTIN ) 100 MG capsule, Take 1 capsule (100 mg total) by mouth 3 (three) times daily., Disp: 90 capsule, Rfl: 3   meloxicam  (MOBIC ) 15 MG tablet, Take 1 tablet (15 mg total) by mouth daily., Disp: 30 tablet, Rfl: 0   oxyCODONE -acetaminophen  (PERCOCET) 5-325 MG tablet, Take 1 tablet by mouth every 4 (four) hours as needed. (Patient not taking: Reported on 12/20/2023), Disp: 12 tablet, Rfl: 0   terbinafine  (LAMISIL ) 250 MG tablet, Take 1 tablet (250 mg total) by mouth daily. (Patient not taking: Reported on 12/20/2023), Disp: 30 tablet, Rfl: 0  Social History   Tobacco Use  Smoking Status  Every Day   Current packs/day: 0.50   Types: Cigarettes  Smokeless Tobacco Never    No Known Allergies Objective:  There were no vitals filed for this visit. There is no height or weight on file to calculate BMI. Constitutional Well developed. Well nourished.  Vascular Dorsalis pedis pulses palpable bilaterally. Posterior tibial pulses palpable bilaterally. Capillary refill normal to all digits.  No cyanosis or clubbing noted. Pedal hair growth normal.  Neurologic Normal speech. Oriented to person, place, and time. Epicritic sensation to light touch grossly present bilaterally.  Dermatologic Bilateral second third fourth interdigital space maceration with superinfection.  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 Onychomycosis due to dermatophyte   2. Other specified local infections of the skin and subcutaneous tissue     Plan:  Patient was evaluated and treated and all questions answered.  Bilateral superinfection of second third fourth interdigital space -All questions and concerns were discussed with the patient in extensive detail - Chronically patient's interdigital infection has not gotten any better.  At this time we will retry Lamisil  and Betadine to see if that helps.  I encouraged him to go to the emergency room.  He states understanding he will try to get himself to the emergency room as soon as possible.  Denies any other acute complaints.   Pes planovalgus/foot deformity -I explained to patient the etiology of pes planovalgus and relationship with heel pain/arch pain and various treatment options were discussed.  Given patient foot structure in the setting of heel pain/arch pain I believe patient will benefit from  custom-made orthotics to help control the hindfoot motion support the arch of the foot and take the stress away from arches.  Patient agrees with the plan like to proceed with orthotics - Patient is awaiting orthotics   No follow-ups on file.

## 2024-04-26 ENCOUNTER — Other Ambulatory Visit: Payer: Self-pay

## 2024-04-26 ENCOUNTER — Emergency Department

## 2024-04-26 ENCOUNTER — Emergency Department
Admission: EM | Admit: 2024-04-26 | Discharge: 2024-04-26 | Disposition: A | Discharge status: Home / Self Care | Attending: Emergency Medicine | Admitting: Emergency Medicine

## 2024-04-26 DIAGNOSIS — B353 Tinea pedis: Secondary | ICD-10-CM | POA: Insufficient documentation

## 2024-04-26 DIAGNOSIS — L03116 Cellulitis of left lower limb: Secondary | ICD-10-CM | POA: Diagnosis not present

## 2024-04-26 DIAGNOSIS — M79671 Pain in right foot: Secondary | ICD-10-CM | POA: Diagnosis present

## 2024-04-26 DIAGNOSIS — L03115 Cellulitis of right lower limb: Secondary | ICD-10-CM | POA: Insufficient documentation

## 2024-04-26 DIAGNOSIS — L03119 Cellulitis of unspecified part of limb: Secondary | ICD-10-CM

## 2024-04-26 LAB — COMPREHENSIVE METABOLIC PANEL WITH GFR
ALT: 16 U/L (ref 0–44)
AST: 19 U/L (ref 15–41)
Albumin: 3.8 g/dL (ref 3.5–5.0)
Alkaline Phosphatase: 118 U/L (ref 38–126)
Anion gap: 10 (ref 5–15)
BUN: 14 mg/dL (ref 6–20)
CO2: 24 mmol/L (ref 22–32)
Calcium: 8.7 mg/dL — ABNORMAL LOW (ref 8.9–10.3)
Chloride: 105 mmol/L (ref 98–111)
Creatinine, Ser: 0.88 mg/dL (ref 0.61–1.24)
GFR, Estimated: >60 mL/min (ref >60)
Glucose, Bld: 123 mg/dL — ABNORMAL HIGH (ref 70–99)
Potassium: 3.8 mmol/L (ref 3.5–5.1)
Sodium: 139 mmol/L (ref 135–145)
Total Bilirubin: 0.4 mg/dL (ref 0.0–1.2)
Total Protein: 7.7 g/dL (ref 6.5–8.1)

## 2024-04-26 LAB — CBC WITH DIFFERENTIAL/PLATELET
Abs Immature Granulocytes: 0.01 K/uL (ref 0.00–0.07)
Basophils Absolute: 0 K/uL (ref 0.0–0.1)
Basophils Relative: 1 %
Eosinophils Absolute: 0.1 K/uL (ref 0.0–0.5)
Eosinophils Relative: 2 %
HCT: 43.5 % (ref 39.0–52.0)
Hemoglobin: 14 g/dL (ref 13.0–17.0)
Immature Granulocytes: 0 %
Lymphocytes Relative: 32 %
Lymphs Abs: 2.3 K/uL (ref 0.7–4.0)
MCH: 27.7 pg (ref 26.0–34.0)
MCHC: 32.2 g/dL (ref 30.0–36.0)
MCV: 86.1 fL (ref 80.0–100.0)
Monocytes Absolute: 0.6 K/uL (ref 0.1–1.0)
Monocytes Relative: 9 %
Neutro Abs: 3.9 K/uL (ref 1.7–7.7)
Neutrophils Relative %: 56 %
Platelets: 255 K/uL (ref 150–400)
RBC: 5.05 MIL/uL (ref 4.22–5.81)
RDW: 14.5 % (ref 11.5–15.5)
WBC: 7 K/uL (ref 4.0–10.5)
nRBC: 0 % (ref 0.0–0.2)

## 2024-04-26 LAB — LACTIC ACID, PLASMA: Lactic Acid, Venous: 1.3 mmol/L (ref 0.5–1.9)

## 2024-04-26 MED ORDER — DOXYCYCLINE HYCLATE 100 MG PO CAPS: 100.0000 mg | ORAL_CAPSULE | Freq: Two times a day (BID) | ORAL | 0 refills | Status: AC

## 2024-04-26 MED ORDER — AMOXICILLIN-POT CLAVULANATE 875-125 MG PO TABS: 1.0000 | ORAL_TABLET | Freq: Two times a day (BID) | ORAL | 0 refills | Status: AC

## 2024-04-26 MED ORDER — HYDROCODONE-ACETAMINOPHEN 5-325 MG PO TABS: 2.0000 | ORAL_TABLET | Freq: Four times a day (QID) | ORAL | 0 refills | Status: DC | PRN

## 2024-04-26 MED ORDER — OXYCODONE-ACETAMINOPHEN 5-325 MG PO TABS
2.0000 | ORAL_TABLET | Freq: Once | ORAL | Status: AC
  Administered 2024-04-26: 2 via ORAL
  Filled 2024-04-26: qty 2

## 2024-04-26 NOTE — ED Provider Notes (Signed)
 [facility] Provider Note    Event Date/Time   First MD Initiated Contact with Patient 04/26/24 0234     (approximate)   History   Foot Pain   HPI Curtis Anderson is a 53 y.o. male who presents for evaluation of pain and infections of his feet.  He is managed by Dr. Franky Blanch with podiatry.  He has had issues ongoing for months and he does not seem to be getting any better.  He had an appointment yesterday with Dr. Blanch who reportedly told him to take oral Lamisil , use Betadine, and go to the emergency department.  The patient said he tried to get his prescription for pain pills filled but the pharmacist would not do it because he said the doctor prescribed him too much.  The patient is frustrated because he seems to be getting worse despite prior treatments.  He has not been on antibiotics recently.  He has not had any recent fever or systemic symptoms, but the skin is sloughing off from the bottoms of his feet and they are quite painful to walk on.     Physical Exam   Triage Vital Signs: ED Triage Vitals  Encounter Vitals Group     BP 04/26/24 0115 124/89     Girls Systolic BP Percentile --      Girls Diastolic BP Percentile --      Boys Systolic BP Percentile --      Boys Diastolic BP Percentile --      Pulse Rate 04/26/24 0115 83     Resp 04/26/24 0115 18     Temp 04/26/24 0115 98.2 F (36.8 C)     Temp Source 04/26/24 0115 Oral     SpO2 04/26/24 0115 95 %     Weight 04/26/24 0116 99.8 kg (220 lb)     Height 04/26/24 0116 1.753 m (5' 9)     Head Circumference --      Peak Flow --      Pain Score 04/26/24 0116 8     Pain Loc --      Pain Education --      Exclude from Growth Chart --     Most recent vital signs: Vitals:   04/26/24 0230 04/26/24 0451  BP:  136/86  Pulse:  74  Resp:  20  Temp:  97.6 F (36.4 C)  SpO2: 100% 98%    General: Awake, alert, conversant and pleasant.  No obvious distress, nontoxic  appearance. CV:  Good peripheral perfusion.  No tachycardia. Resp:  Normal effort. Speaking easily and comfortably, no accessory muscle usage nor intercostal retractions.   Abd:  No distention.  Other:  Bilateral feet are clearly infected with a significant tinea pedis or other dermatophyte.  Additionally he has some swelling most notable on the left foot of the foot itself as well as his toes that could be suggestive of a bacterial superinfection or simply some peripheral edema.  The photos below were taken by the podiatrist earlier today        ED Results / Procedures / Treatments   Labs (all labs ordered are listed, but only abnormal results are displayed) Labs Reviewed  COMPREHENSIVE METABOLIC PANEL WITH GFR - Abnormal; Notable for the following components:      Result Value   Glucose, Bld 123 (*)    Calcium 8.7 (*)    All other components within normal limits  LACTIC ACID, PLASMA  CBC WITH DIFFERENTIAL/PLATELET  RADIOLOGY See ED course for details   PROCEDURES:  Critical Care performed: No  Procedures    IMPRESSION / MDM / ASSESSMENT AND PLAN / ED COURSE  I reviewed the triage vital signs and the nursing notes.                              Differential diagnosis includes, but is not limited to, fungal infection, bacterial superinfection, osteomyelitis.  Patient's presentation is most consistent with acute presentation with potential threat to life or bodily function.  Labs/studies ordered: Lactic acid, CMP, CBC with differential, foot x-rays  Interventions/Medications given:  Medications  oxyCODONE -acetaminophen  (PERCOCET/ROXICET) 5-325 MG per tablet 2 tablet (2 tablets Oral Given 04/26/24 0315)    (Note:  hospital course my include additional interventions and/or labs/studies not listed above.)   I have read the clinic note written by Dr. Franky Blanch earlier today/yesterday and verified the patient's visit.  I see in the note where Dr. Blanch told  him to take the oral Lamisil , use the Betadine, and go to the emergency department.  It is unclear if he had a specific concern that he wanted to be addressed in the emergency department or if it was for general management of the patient's podiatry concerns.  The patient's labs are all stable and within normal limits including no leukocytosis nor elevation of his lactic acid, and his vital signs are stable.  I am not worried about a severe systemic infection .  I will order x-rays to rule out a deep infection causing osteomyelitis which I doubt, and then we will think about additional treatments that may be beneficial for the patient.  Of note, the patient has expressed frustrations about lack of progress from his podiatry visits.  I explained that we always recommend following up with the doctor that knows him the best but it is within his right to seek a second opinion or change podiatrist as well and he said he will think about it.     Clinical Course as of 04/26/24 0548  Sun Apr 26, 2024  0420 DG Foot Complete Left I independently viewed and interpreted the patient's foot x-rays and I see no evidence of osteomyelitis.  Confirmed by radiology [CF]  [phone] I reassessed the patient and he is sleeping comfortably.  We talked about the plan.  I also consulted with pharmacy and I verified that oral antifungals together with a fluoroquinolone would likely not be a good idea because of the potential for significant QTc prolongation.  I wanted to give a fluoroquinolone for the pseudomonal coverage, but the risk is probably greater than the benefit.  I discussed the plan with the patient after verifying that he has a prescription for oral Lamisil  (I was considering oral fluconazole).  He will take that medicine and I prescribed Augmentin and doxycycline , both for a 10-day course.  Hopefully the combination of the antibacterial and antifungal agents will improve his symptoms.  I encouraged him to follow-up  with Dr. Blanch but I also gave him follow-up information with a different podiatry clinic should he wish to seek a different opinion.  I gave my usual customary return precautions and he agrees with the plan. [CF]    Clinical Course User Index [CF] Gordan Huxley, MD     FINAL CLINICAL IMPRESSION(S) / ED DIAGNOSES   Final diagnoses:  Tinea pedis of both feet  Cellulitis of foot     Rx / DC  Orders   ED Discharge Orders          Ordered    amoxicillin-clavulanate (AUGMENTIN) 875-125 MG tablet  2 times daily        04/26/24 0443    doxycycline  (VIBRAMYCIN ) 100 MG capsule  2 times daily        04/26/24 0443    HYDROcodone -acetaminophen  (NORCO/VICODIN) 5-325 MG tablet  Every 6 hours PRN        04/26/24 0443             Note:  This document was prepared using Dragon voice recognition software and may include unintentional dictation errors.   Gordan Huxley, MD 04/26/24 [phone]

## 2024-04-26 NOTE — ED Triage Notes (Signed)
 Pt presents for infections in the feet. States he was seen by podiatrist today and prescribed antibiotics and pain medication but the pharmacy was unable to fill it. Pt states the bottom of his feet are sloughing off. Endorsing redness and swelling. Denies fevers, chills.   Past Medical History:  Diagnosis Date   Hypertension    Sleep apnea

## 2024-04-26 NOTE — Discharge Instructions (Addendum)
 Please continue taking the terbinafine  (Lamisil ) tablets prescribed by Dr. Tobie.  In addition, we wrote you prescriptions for 2 antibacterial medications that may help.  Continue to use any other treatments and recommendations provided by Dr. Tobie and follow-up with him or with another podiatrist of your choice if you prefer to get a second opinion.  Return to the emergency department or your podiatry clinic if you develop new or worsening symptoms that concern you.  Take Norco as prescribed for severe pain. Do not drink alcohol, drive or participate in any other potentially dangerous activities while taking this medication as it may make you sleepy. Do not take this medication with any other sedating medications, either prescription or over-the-counter. If you were prescribed Percocet or Vicodin, do not take these with acetaminophen  (Tylenol ) as it is already contained within these medications.   This medication is an opiate (or narcotic) pain medication and can be habit forming.  Use it as little as possible to achieve adequate pain control.  Do not use or use it with extreme caution if you have a history of opiate abuse or dependence.  If you are on a pain contract with your primary care doctor or a pain specialist, be sure to let them know you were prescribed this medication today from the [facility] Emergency Department.  This medication is intended for your use only - do not give any to anyone else and keep it in a secure place where nobody else, especially children, have access to it.  It will also cause or worsen constipation, so you may want to consider taking an over-the-counter stool softener while you are taking this medication.

## 2024-04-26 NOTE — ED Notes (Addendum)
 PT states he went to get a pedicure on 11/8 and his feet have gotten worse since then. Bilateral feet are macerated. L foot is peeling. Serosanguinous drainage present.  If they dry out they itch but when its sweaty it doesn't itch.

## 2024-05-10 ENCOUNTER — Other Ambulatory Visit: Payer: Self-pay

## 2024-05-10 ENCOUNTER — Emergency Department
Admission: EM | Admit: 2024-05-10 | Discharge: 2024-05-10 | Disposition: A | Discharge status: Home / Self Care | Attending: Emergency Medicine | Admitting: Emergency Medicine

## 2024-05-10 DIAGNOSIS — M7989 Other specified soft tissue disorders: Secondary | ICD-10-CM | POA: Diagnosis present

## 2024-05-10 DIAGNOSIS — B353 Tinea pedis: Secondary | ICD-10-CM | POA: Insufficient documentation

## 2024-05-10 MED ORDER — OXYCODONE-ACETAMINOPHEN 5-325 MG PO TABS
1.0000 | ORAL_TABLET | Freq: Once | ORAL | Status: AC
  Administered 2024-05-10: 1 via ORAL
  Filled 2024-05-10: qty 1

## 2024-05-10 MED ORDER — GABAPENTIN 100 MG PO CAPS: 100.0000 mg | ORAL_CAPSULE | Freq: Three times a day (TID) | ORAL | 0 refills | Status: DC | PRN

## 2024-05-10 MED ORDER — OXYCODONE-ACETAMINOPHEN 5-325 MG PO TABS: 1.0000 | ORAL_TABLET | ORAL | 0 refills | Status: DC | PRN

## 2024-05-10 MED ORDER — GABAPENTIN 300 MG PO CAPS
300.0000 mg | ORAL_CAPSULE | Freq: Once | ORAL | Status: AC
  Administered 2024-05-10: 300 mg via ORAL
  Filled 2024-05-10: qty 1

## 2024-05-10 NOTE — ED Notes (Addendum)
 Per pt he was here 2 weeks ago with his feet cracking, burning, and oozing between toes and was given ABX. Pt states that he has been taking 3 types of ABX for feet for 2 weeks, stopped yesterday  but did not take all of the pills. States that a specialist prescribed pain pills but the pharmacy would not give him the pills because they said they prescribed too many.  Pt endorses bilateral foot pain described as burning and when it cracks it hurts real bad.  Visible dryness to bilateral feet and cracking underneath toes noted.  No oozing noted.  Pt states he has been told in the past that he is prediabetic.

## 2024-05-10 NOTE — ED Triage Notes (Signed)
 Pt arrived via POV with c/o bilateral foot problem, states he was seen here over a week ago for the same thing states his feet are cracking, pt has podiatrist. States he is on 3 different antibiotics. Reports the problem is not improving despite the medications.

## 2024-05-10 NOTE — ED Provider Notes (Signed)
 [facility] Provider Note   Event Date/Time   First MD Initiated Contact with Patient 05/10/24 0448     (approximate) History  Foot Problem  HPI Curtis Anderson is a 53 y.o. male with stated past medical history of tinea pedis who presents complaining of burning to bilateral feet in the setting of persistent bilateral fungal infection.  Patient states that he has been taking Augmentin , doxycycline , and terbinafine  however stopped these medications as he was told that they could be damaging to the liver.  Patient states that since these have stopped, his symptoms have become worse.  Patient has been using oxycodone  for this pain but is out of these medications. ROS: Patient currently denies any vision changes, tinnitus, difficulty speaking, facial droop, sore throat, chest pain, shortness of breath, abdominal pain, nausea/vomiting/diarrhea, dysuria, or weakness/numbness/paresthesias in any extremity   Physical Exam  Triage Vital Signs: ED Triage Vitals  Encounter Vitals Group     BP 05/10/24 0441 138/86     Girls Systolic BP Percentile --      Girls Diastolic BP Percentile --      Boys Systolic BP Percentile --      Boys Diastolic BP Percentile --      Pulse Rate 05/10/24 0441 67     Resp 05/10/24 0441 18     Temp 05/10/24 0441 98.4 F (36.9 C)     Temp Source 05/10/24 0441 Oral     SpO2 05/10/24 0441 100 %     Weight 05/10/24 0443 220 lb (99.8 kg)     Height 05/10/24 0443 5' 9.5 (1.765 m)     Head Circumference --      Peak Flow --      Pain Score 05/10/24 0443 7     Pain Loc --      Pain Education --      Exclude from Growth Chart --    Most recent vital signs: Vitals:   05/10/24 0441  BP: 138/86  Pulse: 67  Resp: 18  Temp: 98.4 F (36.9 C)  SpO2: 100%   General: Awake, oriented x4. CV:  Good peripheral perfusion. Resp:  Normal effort. Abd:  No distention. Other:  Middle-aged obese African-American male resting comfortably in no acute  distress.  Pain to palpation, erythema, and excoriation to bilateral toes in between digits ED Results / Procedures / Treatments  Labs (all labs ordered are listed, but only abnormal results are displayed) Labs Reviewed - No data to display PROCEDURES: Critical Care performed: No Procedures MEDICATIONS ORDERED IN ED: Medications  gabapentin  (NEURONTIN ) capsule 300 mg (has no administration in time range)  oxyCODONE -acetaminophen  (PERCOCET/ROXICET) 5-325 MG per tablet 1 tablet (has no administration in time range)   IMPRESSION / MDM / ASSESSMENT AND PLAN / ED COURSE  I reviewed the triage vital signs and the nursing notes.                             The patient is on the cardiac monitor to evaluate for evidence of arrhythmia and/or significant heart rate changes. Patient's presentation is most consistent with acute presentation with potential threat to life or bodily function. Patient 53 year old male with the above-stated past medical history presents for persistent pain secondary to bilateral tinea pedis.  Patient states that he has stopped all of his medications recently and has had becoming worse.  There is no overt signs of bacterial infection on exam however  patient certainly still has tinea pedis bilaterally.  Patient encouraged to restart all of these medications and will provide patient further pain control with a short course of oxycodone  as well as gabapentin .  Patient agrees with plan for discharge at this time with podiatry follow-up.  Patient given strict return precautions and all questions answered prior to discharge  Dispo: Discharge home with podiatry follow-up   FINAL CLINICAL IMPRESSION(S) / ED DIAGNOSES   Final diagnoses:  Tinea pedis of both feet   Rx / DC Orders   ED Discharge Orders          Ordered    gabapentin  (NEURONTIN ) 100 MG capsule  3 times daily PRN        05/10/24 0622    oxyCODONE -acetaminophen  (PERCOCET) 5-325 MG tablet  Every 4 hours PRN         05/10/24 0622           Note:  This document was prepared using Dragon voice recognition software and may include unintentional dictation errors.   Roosvelt Churchwell K, MD 05/10/24 [phone]

## 2024-06-10 ENCOUNTER — Other Ambulatory Visit: Payer: Self-pay | Admitting: Orthopedic Surgery

## 2024-06-10 DIAGNOSIS — G8929 Other chronic pain: Secondary | ICD-10-CM

## 2024-06-10 DIAGNOSIS — M25371 Other instability, right ankle: Secondary | ICD-10-CM

## 2024-06-13 ENCOUNTER — Inpatient Hospital Stay
Admission: RE | Admit: 2024-06-13 | Discharge: 2024-06-13 | Attending: Orthopedic Surgery | Admitting: Orthopedic Surgery

## 2024-06-13 DIAGNOSIS — M25371 Other instability, right ankle: Secondary | ICD-10-CM

## 2024-06-13 DIAGNOSIS — G8929 Other chronic pain: Secondary | ICD-10-CM

## 2024-07-08 ENCOUNTER — Ambulatory Visit: Admitting: Physician Assistant

## 2024-07-10 NOTE — Progress Notes (Unsigned)
" ° °  Acute Office Visit  Patient ID: Curtis Anderson, male    DOB: May 01, 1971, 54 y.o.   MRN: [id number]  PCP: Wellington Curtis LABOR, FNP (Inactive)  No chief complaint on file.   Subjective:     HPI  Discussed the use of AI scribe software for clinical note transcription with the patient, who gave verbal consent to proceed.  History of Present Illness    ROS     Objective:    There were no vitals taken for this visit. {Vitals History (Optional):23777}  Physical Exam Vitals reviewed.  Constitutional:      General: He is not in acute distress.    Appearance: Normal appearance. He is not ill-appearing.  Pulmonary:     Effort: Pulmonary effort is normal. No respiratory distress.  Neurological:     Mental Status: He is alert and oriented to person, place, and time.  Psychiatric:        Mood and Affect: Mood normal.        Behavior: Behavior normal.        Thought Content: Thought content normal.     {PhysExam Abridge (Optional):210964309}  No results found for any visits on 07/13/24.     Assessment & Plan:   Problem List Items Addressed This Visit     Chronic bilateral low back pain without sciatica - Primary   Other Visit Diagnoses       Immunization due           Assessment and Plan Assessment & Plan     No orders of the defined types were placed in this encounter.   No follow-ups on file.  Rockie Agent, MD [facility]   "

## 2024-07-13 ENCOUNTER — Ambulatory Visit: Admitting: Family Medicine

## 2024-07-13 DIAGNOSIS — M545 Low back pain, unspecified: Secondary | ICD-10-CM

## 2024-07-13 DIAGNOSIS — Z23 Encounter for immunization: Secondary | ICD-10-CM

## 2024-07-14 ENCOUNTER — Encounter: Payer: Self-pay | Admitting: Family Medicine

## 2024-07-14 ENCOUNTER — Ambulatory Visit: Admitting: Family Medicine

## 2024-07-14 VITALS — BP 131/84 | HR 68 | Ht 69.5 in | Wt 230.0 lb

## 2024-07-14 DIAGNOSIS — G8929 Other chronic pain: Secondary | ICD-10-CM

## 2024-07-14 DIAGNOSIS — S82891K Other fracture of right lower leg, subsequent encounter for closed fracture with nonunion: Secondary | ICD-10-CM

## 2024-07-14 DIAGNOSIS — M545 Low back pain, unspecified: Secondary | ICD-10-CM

## 2024-07-14 DIAGNOSIS — R030 Elevated blood-pressure reading, without diagnosis of hypertension: Secondary | ICD-10-CM

## 2024-07-14 DIAGNOSIS — M5416 Radiculopathy, lumbar region: Secondary | ICD-10-CM | POA: Diagnosis not present

## 2024-07-14 MED ORDER — MELOXICAM 15 MG PO TABS: 15.0000 mg | ORAL_TABLET | Freq: Every day | ORAL | 0 refills | Status: AC

## 2024-07-14 MED ORDER — GABAPENTIN 100 MG PO CAPS: 100.0000 mg | ORAL_CAPSULE | Freq: Three times a day (TID) | ORAL | 0 refills | Status: AC | PRN

## 2024-07-14 NOTE — Progress Notes (Unsigned)
 "  Acute Office Visit  Patient ID: Curtis Anderson, male    DOB: 04-23-1971, 54 y.o.   MRN: [id number]  PCP: Sharma Coyer, MD  Chief Complaint  Patient presents with   Back Pain    Chronic issue over 2 years, worsening over the past few months. Pins and needles feeling worse at night middle/ lower, aggravating/ burning pain    Ankle Pain    Right ankle, fractured back in October through a fall. Patient sees foot/ ankle specialist and had mri done, had questions regarding MRI     Subjective:     HPI  Discussed the use of AI scribe software for clinical note transcription with the patient, who gave verbal consent to proceed.  History of Present Illness Curtis Anderson is a 54 year old male who presents with ankle and back pain.  He has been experiencing chronic back pain for two years, described as a burning sensation with 'pins and needles' that worsens at night and requires him to stop activities. He was previously prescribed gabapentin  and meloxicam , which provided some relief, but he discontinued them due to side effects.  He is experiencing right ankle pain following a fracture in October after a fall. An MRI showed a nondisplaced medial malleolus fracture, incomplete healing of the anterior cortex of the distal fibula, complete tears of the anterior talofibular and calcaneofibular ligaments, and mild peroneal tenosynovitis. He uses a lace-up ankle brace for stabilization. He has not had a follow-up with his orthopedic specialist to discuss the MRI results.  He mentions a history of elevated blood pressure, with a recent reading of 138/90, but has never been on medication for hypertension.  He has a history of sleep apnea and was prescribed a CPAP machine, which he discontinued due to discomfort and a 'phobia' of using it. He has not seen a sleep specialist since.  He has a history of foot fungus for which he was treated with terbinafine , which he has completed. He also  mentions a previous issue with frequent urination, which was resolved after discontinuing certain medications.   ROS     Objective:    BP 131/84 (Cuff Size: Normal)   Pulse 68   Ht 5' 9.5 (1.765 m)   Wt 230 lb (104.3 kg)   SpO2 97%   BMI 33.48 kg/m  BP Readings from Last 3 Encounters:  07/14/24 131/84  05/10/24 (!) 149/99  04/26/24 136/86   Wt Readings from Last 3 Encounters:  07/14/24 230 lb (104.3 kg)  05/10/24 220 lb (99.8 kg)  04/26/24 220 lb (99.8 kg)    MR ANKLE RIGHT WO CONTRAST Result Date: 06/25/2024 CLINICAL DATA:  The patient suffered a right ankle fracture in a fall from scaffolding 11/19/2023. Continued right ankle pain radiating into the leg. EXAM: MRI OF THE RIGHT ANKLE WITHOUT CONTRAST TECHNIQUE: Multiplanar, multisequence MR imaging of the ankle was performed. No intravenous contrast was administered. COMPARISON:  Plain films right ankle 11/19/2023. FINDINGS: TENDONS Peroneal: Intact. There is a small volume of fluid in the sheaths of the tendons compatible with tenosynovitis. Posteromedial: Intact. Anterior: Intact. Achilles: Intact. Plantar Fascia: Intact.  No evidence of plantar fasciitis. LIGAMENTS Lateral: The anterior talofibular ligament and calcaneofibular ligament are both completely torn. Lateral ligaments are otherwise intact. Medial: Intact. CARTILAGE Ankle Joint: Small joint effusion. No osteochondral lesion of the talar dome Subtalar Joints/Sinus Tarsi: Normal. Bones: Nondisplaced medial malleolar fracture is identified with some marrow edema about the fracture. There appears to be bridging  bone present. There is also an incomplete, healing fracture through the anterior cortex of the distal fibula. Small focus of mild marrow edema in a small cyst in the calcaneus at the calcaneocuboid joint is likely due to degenerative change but could reflect contusion. Other: None. IMPRESSION: 1. Nondisplaced medial malleolar fracture appears to be healing. 2. Incomplete,  healing fracture through the anterior cortex of the distal fibula. 3. Complete tears of the anterior talofibular and calcaneofibular ligaments. 4. Mild peroneal tenosynovitis. Electronically Signed   By: Debby Prader M.D.   On: 06/25/2024 09:41   DG Foot Complete Left Result Date: 04/26/2024 EXAM: 3 VIEW(S) XRAY OF THE LEFT FOOT 04/26/2024 03:24:37 AM COMPARISON: None available. CLINICAL HISTORY: foot infection, evaluate soft tissue and possible osteomyelitis FINDINGS: BONES AND JOINTS: No acute fracture. No focal osseous lesion. No joint dislocation. No erosions. SOFT TISSUES: Mild soft tissue swelling of the left forefoot. IMPRESSION: 1. Mild soft tissue swelling of the left forefoot. 2. No erosions. Electronically signed by: Dorethia Molt MD 04/26/2024 03:54 AM EST RP Workstation: [id number]     Physical Exam VITALS: BP- 138/90 EXTREMITIES: Right ankle swelling. No bruising noted, some tenderness with ROM testing  MUSCULOSKELETAL: No spinal deformities. Normal spinal range of motion. Tenderness in left paraspinal region     No results found for any visits on 07/14/24.     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 MRI shows incomplete healing of the anterior cortex of the distal fibula, complete tears of the anterior talofibular and calcaneofibular ligaments, and mild peroneal tenosynovitis. The fracture is still present and requires further evaluation and management. - Referred to a foot and ankle specialist for further evaluation and management. - Continue using a lace-up ankle brace for stabilization.  Chronic low back pain with lumbar radiculopathy Chronic low back pain for two years with radiculopathy symptoms, including burning sensation and pins and needles, worse at night. 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 Pain exacerbated by physical activity and relieved by rest. Gabapentin  and meloxicam  previously prescribed for pain management. - Restart gabapentin  100 mg three times a day, preferably at night to avoid daytime drowsiness. - Continue meloxicam  15 mg once daily for pain and inflammation. - Referred to neurosurgery for further evaluation of radiculopathy symptoms.  Obstructive sleep apnea Chronic  Previously prescribed CPAP, which he discontinued due to phobia of using it. No current follow-up with sleep specialist or pulmonology. Discussed potential risks of untreated sleep apnea, including hypertension and cardiovascular strain. - Discuss alternative treatment options  for sleep apnea with a sleep specialist or pulmonologist.  Elevated blood pressure Appears to have fluctuations in BP from chart review  Blood pressure recorded at 138/90 mmHg, which is elevated. No history of hypertension or antihypertensive medication use. Discussed potential impact of untreated sleep apnea on blood pressure. - Rechecked blood pressure during the visit,improved, will reassess at follow up visit - Monitor blood pressure and consider lifestyle modifications or further evaluation if  elevated.    Meds ordered this encounter  Medications   gabapentin  (NEURONTIN ) 100 MG capsule    Sig: Take 1 capsule (100 mg total) by mouth 3 (three) times daily as needed (Pain).    Dispense:  90 capsule    Refill:  0   meloxicam  (MOBIC ) 15 MG tablet    Sig: Take 1 tablet (15 mg total) by mouth daily.    Dispense:  30 tablet    Refill:  0    Return in about 2 months (around 09/11/2024) for BP, ankle and back f/u .  Rockie Agent, MD [facility]   "

## 2024-07-14 NOTE — Patient Instructions (Signed)
 To keep you healthy, please keep in mind the following health maintenance items that you are due for:   Health Maintenance Due  Topic Date Due   Influenza Vaccine  Never done   Hepatitis B Vaccines 19-59 Average Risk (2 of 2 - CpG 2-dose series) 01/17/2024   COVID-19 Vaccine (1 - 2025-26 season) Never done   Zoster Vaccines- Shingrix  (2 of 2) 02/14/2024     Best Wishes,   Dr. Lang

## 2024-07-30 ENCOUNTER — Encounter: Admitting: Family Medicine

## 2024-08-12 ENCOUNTER — Ambulatory Visit: Admitting: Orthopedic Surgery

## 2024-09-11 ENCOUNTER — Ambulatory Visit: Admitting: Family Medicine
